# Patient Record
Sex: Female | Born: 1995 | Race: White | Hispanic: No
Health system: Western US, Academic
[De-identification: ages and names within clinical notes are randomized; demographics above are authoritative.]

## PROBLEM LIST (undated history)

## (undated) DIAGNOSIS — M6790 Unspecified disorder of synovium and tendon, unspecified site: Secondary | ICD-10-CM

## (undated) DIAGNOSIS — G40909 Epilepsy, unspecified, not intractable, without status epilepticus: Secondary | ICD-10-CM

## (undated) DIAGNOSIS — G43909 Migraine, unspecified, not intractable, without status migrainosus: Secondary | ICD-10-CM

## (undated) HISTORY — DX: Unspecified disorder of synovium and tendon, unspecified site: M67.90

## (undated) HISTORY — DX: Migraine, unspecified, not intractable, without status migrainosus: G43.909

## (undated) HISTORY — PX: TYMPANOSTOMY TUBE PLACEMENT: SHX32

## (undated) HISTORY — DX: Epilepsy, unspecified, not intractable, without status epilepticus: G40.909

## (undated) HISTORY — PX: BREAST SURGERY: SHX581

---
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
## 2013-01-16 ENCOUNTER — Telehealth: Payer: Self-pay | Admitting: Pediatrics

## 2013-01-16 NOTE — Telephone Encounter (Addendum)
Headache calendar from May 2014 on [place]. 30 days were recorded.  16 days were headache free.  10 days were associated with tension type headaches, 7 required treatment.  There were 4 days of migraines, 2 were severe.  I left a message with the patient to call me at 6 PM on June 4.I spoke with the patient for a minute and a half.  She does not want to make changes in her medication.  She apparently has very detailed notes on her bone calendar but we will discuss when I see her on June 16.

## 2013-01-26 DIAGNOSIS — G40309 Generalized idiopathic epilepsy and epileptic syndromes, not intractable, without status epilepticus: Secondary | ICD-10-CM | POA: Insufficient documentation

## 2013-01-26 DIAGNOSIS — G40209 Localization-related (focal) (partial) symptomatic epilepsy and epileptic syndromes with complex partial seizures, not intractable, without status epilepticus: Secondary | ICD-10-CM | POA: Insufficient documentation

## 2013-01-26 DIAGNOSIS — G43009 Migraine without aura, not intractable, without status migrainosus: Secondary | ICD-10-CM | POA: Insufficient documentation

## 2013-01-30 ENCOUNTER — Encounter: Payer: Self-pay | Admitting: Pediatrics

## 2013-01-30 ENCOUNTER — Ambulatory Visit (INDEPENDENT_AMBULATORY_CARE_PROVIDER_SITE_OTHER): Payer: 59 | Admitting: Pediatrics

## 2013-01-30 VITALS — BP 100/60 | HR 84 | Ht 63.25 in | Wt 119.4 lb

## 2013-01-30 DIAGNOSIS — G40309 Generalized idiopathic epilepsy and epileptic syndromes, not intractable, without status epilepticus: Secondary | ICD-10-CM

## 2013-01-30 DIAGNOSIS — G43009 Migraine without aura, not intractable, without status migrainosus: Secondary | ICD-10-CM

## 2013-01-30 DIAGNOSIS — G40209 Localization-related (focal) (partial) symptomatic epilepsy and epileptic syndromes with complex partial seizures, not intractable, without status epilepticus: Secondary | ICD-10-CM

## 2013-01-30 NOTE — Progress Notes (Addendum)
Patient: Mary Jacobs MRN: [id number] Sex: female DOB: Jan 10, 1996  Provider: Deetta Perla, MD Location of Care: [facility]  Note type: Routine return visit  History of Present Illness: Referral Source: Dr. Bufford SpikesNicholaus Bloom History from: mother, patient and [facility] chart Chief Complaint: Seizure/Migraines  Mary Jacobs is a 17 y.o. female who returns for evaluation of seizures, migraines, and episodic tension type headaches.Mary Jacobs returns today for the first time since August 08, 2012 with a history of partial onset seizures with secondary generalization that are nocturnal and a mixture of episodic tension type headaches and migraine without aura.  Her last seizure was on September 20, 2011, when she forgot to take her nighttime dose of medicine.  She has kept detailed records of her headaches, which worsened during the school year and lessened considerably since the conclusion of the school year.  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  She is going to [employer] and has not yet chosen a major.  Her headaches have been treated with propranolol and topiramate which failed to control her headaches.  I am not certain she ever had cognitive effects of the topiramate, but one option if verapamil fails to prevent her headaches is Trokendi another Depakote, beta blockers, Effexor, Keppra, and cyproheptadine.  We have also not tried magnesium, riboflavin or feverfew.  These were all possible options.  For the time being, her headaches have settled during the summer.  I suspect that stress is the major determinant of her headaches.  I do not know whether college will prove to be more or less stressful than high school.  I think that she may have more time to herself.  She is a very disciplined and organized  young woman and if she continues organized behavior, she may get more sleep in college than she did in high school.  Review of Systems: 12 system review was remarkable for headache  Past Medical History  Diagnosis Date  . Migraine   . Epilepsy    Hospitalizations: yes, Head Injury: no, Nervous System Infections: no, Immunizations up to date: yes Past Medical History Comments: Patient was hospitalized at 17 years old due to seizure activity. Seizures were partial onset with secondary generalization.  MRI of the brain was normal.  EEG showed diffuse slowing as part of a post ictal state. She had onset of migraines at 35 or 17 years of age. She has taken Zomig with transient benefit.  Propranolol was started in 2008 as a preventative medication for migraine increased to 20 mg BId.  She has tried Maxalt, Axert, and Relpax without benefit.  Imitrex/sumatriptan has been more effective.  Atenolol was started in Ausgust, 2009 and adjusted to 100 mg.  Topiramate was started March, 2011.  She reached 50 mg q HS and stopped it in January, 2012 due to cognitive blunting.  Verapamil was started February, 2013.  Birth History 8 pound infant born to a gravida 3 para 2002 woman. Pregnancy labor and delivery were unremarkable. Patient went home with her mother. Growth and development was normal.  Behavior History none  Surgical History Past Surgical History  Procedure Laterality Date  . Tympanostomy tube placement     Surgeries: no Surgical History Comments: None  Family History family history includes Asthma in her maternal grandmother; Breast cancer in her maternal aunt; Cancer  in her brother and mother; Migraines in her brother, maternal uncle, and mother; Rectal cancer in her paternal grandfather; and Stroke in her maternal grandfather. Family History is negative migraines, seizures, cognitive impairment, blindness, deafness, birth defects, chromosomal disorder, autism.  Social History History    Social History  . Marital Status: Single    Spouse Name: N/A    Number of Children: N/A  . Years of Education: N/A   Social History Main Topics  . Smoking status: Never Smoker   . Smokeless tobacco: Never Used  . Alcohol Use: No  . Drug Use: No  . Sexually Active: Not Currently   Other Topics Concern  . None   Social History Narrative  . 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  Hobbies/Interest: [employer], lacrosse, reading School comments Mary Jacobs has done well in her studies.  Current Outpatient Prescriptions on File Prior to Visit  Medication Sig Dispense Refill  . levETIRAcetam (KEPPRA) 500 MG tablet Take 500 mg by mouth every 12 (twelve) hours. 3 tabs bid       . SUMATRIPTAN SUCCINATE PO Take 100 mg by mouth as needed (1 at onset of migraine with 400 mg of Ibuprofen). Prn        No current facility-administered medications on file prior to visit.   The medication list was reviewed and reconciled. All changes or newly prescribed medications were explained.  A complete medication list was provided to the patient/caregiver.  No Known Allergies  Physical Exam BP 100/60  Pulse 84  Ht 5' 3.25" (1.607 m)  Wt 119 lb 6.4 oz (54.159 kg)  BMI 20.97 kg/m2  General: alert, well developed, well nourished, in no acute distress, blond hair, blue eyes, right handed Head: normocephalic, no dysmorphic features Ears, Nose and Throat: Otoscopic: Tympanic membranes normal.  Pharynx: oropharynx is pink without exudates or tonsillar hypertrophy. Neck: supple, full range of motion, no cranial or cervical bruits Respiratory: auscultation clear Cardiovascular: no murmurs, pulses are normal Musculoskeletal: no skeletal deformities or apparent scoliosis Skin: no rashes or neurocutaneous lesions  Neurologic  Exam  Mental Status: alert; oriented to person, place and year; knowledge is normal for age; language is normal Cranial Nerves: visual fields are full to double simultaneous stimuli; extraocular movements are full and conjugate; pupils are around reactive to light; funduscopic examination shows sharp disc margins with normal vessels; symmetric facial strength; midline tongue and uvula; air conduction is greater than bone conduction bilaterally. Motor: Normal strength, tone and mass; good fine motor movements; no pronator drift. Sensory: intact responses to cold, vibration, proprioception and stereognosis Coordination: good finger-to-nose, rapid repetitive alternating movements and finger apposition Gait and Station: normal gait and station: patient is able to walk on heels, toes and tandem without difficulty; balance is adequate; Romberg exam is negative; Gower response is negative Reflexes: symmetric and diminished bilaterally; no clonus; bilateral flexor plantar responses.  Assessment 1. Complex partial seizures with secondary generalization, well controlled (345.40), (345.10). 2. Migraine without aura (346.10). 3. Episodic tension type headaches (339.11).  Plan 1. Continue verapamil for now at its current dose.  Other options have been noted above. 2. She will continue to keep daily prospective headache calendars that will be sent to my office at the end of each month.  I will contact her and discuss changes in treatment. 3. We will work out logistics to make certain  that she can get her medication in [place] on a timely basis. 4. I will definitely see her in December 2014 at Christmas break and would be happy to see her in October 2014, if she is having difficulty with headaches or seizures.  I spent 30 minutes of face-to-face time with [facility] and her mother, more than half of it in consultation.  Meds ordered this encounter  Medications  . verapamil (CALAN) 40 MG tablet    Sig: Take 40  mg by mouth 2 (two) times daily. 2 po q am and 3 po q hs   Deetta Perla MD

## 2013-01-30 NOTE — Patient Instructions (Signed)
Have. a great summer!   Let me know what arrangements need to be made for your prescriptions.  I will see you in October if need be and definitely in December.

## 2013-02-22 ENCOUNTER — Ambulatory Visit (INDEPENDENT_AMBULATORY_CARE_PROVIDER_SITE_OTHER): Payer: 59 | Admitting: Women's Health

## 2013-02-22 ENCOUNTER — Encounter: Payer: Self-pay | Admitting: Women's Health

## 2013-02-22 VITALS — BP 106/66 | Ht 63.5 in | Wt 121.0 lb

## 2013-02-22 DIAGNOSIS — Z01419 Encounter for gynecological examination (general) (routine) without abnormal findings: Secondary | ICD-10-CM

## 2013-02-22 NOTE — Progress Notes (Signed)
Patient ID: Mary Jacobs, female   DOB: 08/10/1996, 17 y.o.   MRN: [id number] Presents for College physical form to be completed. Had annual exam in December. Gardasil series completed. Not sexually active has a long-term boyfriend. History of epilepsy seizure-free for greater than one year. 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 Without complaint today. Requesting Mirena. Regular monthly cycle.  Exam: Appears well.   Epilepsy on Keppra Migraines without aura. Contraception management  Plan: Options reviewed, Mirena information given and reviewed slight risk for infection, perforation, hemorrhage. Reviewed Dr. Audie Box would place with cycle will check coverage. SBE's, continue healthy exercise and diet. College safety reviewed, reviewed importance of self-care in relationship to epilepsy. Health physical and  form completed

## 2013-02-23 ENCOUNTER — Other Ambulatory Visit: Payer: Self-pay

## 2013-02-23 ENCOUNTER — Other Ambulatory Visit: Payer: Self-pay | Admitting: Gynecology

## 2013-02-23 ENCOUNTER — Telehealth: Payer: Self-pay | Admitting: Gynecology

## 2013-02-23 DIAGNOSIS — G40209 Localization-related (focal) (partial) symptomatic epilepsy and epileptic syndromes with complex partial seizures, not intractable, without status epilepticus: Secondary | ICD-10-CM

## 2013-02-23 DIAGNOSIS — Z3049 Encounter for surveillance of other contraceptives: Secondary | ICD-10-CM

## 2013-02-23 MED ORDER — KEPPRA 500 MG PO TABS: ORAL_TABLET | ORAL | Status: DC

## 2013-02-23 MED ORDER — LEVONORGESTREL 20 MCG/24HR IU IUD: INTRAUTERINE_SYSTEM | Freq: Once | INTRAUTERINE | Status: DC

## 2013-02-23 NOTE — Telephone Encounter (Signed)
02/23/13-Pt called today and told that Mirena IUD & insertion covered at 100% by her [employer] ins. She will call next cycle to schedule with TF/WL

## 2013-03-20 ENCOUNTER — Encounter: Payer: Self-pay | Admitting: Gynecology

## 2013-03-20 ENCOUNTER — Ambulatory Visit (INDEPENDENT_AMBULATORY_CARE_PROVIDER_SITE_OTHER): Payer: 59 | Admitting: Gynecology

## 2013-03-20 DIAGNOSIS — N882 Stricture and stenosis of cervix uteri: Secondary | ICD-10-CM

## 2013-03-20 DIAGNOSIS — Z30431 Encounter for routine checking of intrauterine contraceptive device: Secondary | ICD-10-CM

## 2013-03-20 NOTE — Patient Instructions (Signed)
Intrauterine Device Insertion Most often, an intrauterine device (IUD) is inserted into the uterus to prevent pregnancy. There are 2 types of IUDs available:  Copper IUD. This type of IUD creates an environment that is not favorable to sperm survival. The mechanism of action of the copper IUD is not known for certain. It can stay in place for 10 years.  Hormone IUD. This type of IUD contains the hormone progestin (synthetic progesterone). The progestin thickens the cervical mucus and prevents sperm from entering the uterus, and it also thins the uterine lining. There is no evidence that the hormone IUD prevents implantation. The hormone IUD can stay in place for up to 5 years. An IUD is the most cost-effective birth control if left in place for the full duration. It may be removed at any time. LET YOUR CAREGIVER KNOW ABOUT:  Sensitivity to metals.  Medicines taken including herbs, eyedrops, over-the-counter medicines, and creams.  Use of steroids (by mouth or creams).  Previous problems with anesthetics or numbing medicine.  Previous gynecological surgery.  History of blood clots or clotting disorders.  Possibility of pregnancy.  Menstrual irregularities.  Concerns regarding unusual vaginal discharge or odors.  Previous experience with an IUD.  Other health problems. RISKS AND COMPLICATIONS  Accidental puncture (perforation) of the uterus.  Accidental placement of the IUD either in the muscle layer of the uterus (myometrium) or outside the uterus. If this happen, the IUD can be found essentially floating around the bowels. When this happens, the IUD must be taken out surgically.  The IUD may fall out of the uterus (expulsion). This is more common in women who have recently had a child.   Pregnancy in the fallopian tube (ectopic). BEFORE THE PROCEDURE  Schedule the IUD insertion for when you will have your menstrual period or right after, to make sure you are not pregnant.  Placement of the IUD is better tolerated shortly after a menstrual cycle.  You may need to take tests or be examined to make sure you are not pregnant.  You may be required to take a pregnancy test.  You may be required to get checked for sexually transmitted infections (STIs) prior to placement. Placing an IUD in someone who has an infection can make an infection worse.  You may be given a pain reliever to take 1 or 2 hours before the procedure.  An exam will be performed to determine the size and position of your uterus.  Ask your caregiver about changing or stopping your regular medicines. PROCEDURE   A tool (speculum) is placed in the vagina. This allows your caregiver to see the lower part of the uterus (cervix).  The cervix is prepped with a medicine that lowers the risk of infection.  You may be given a medicine to numb each side of the cervix (intracervical or paracervical block). This is used to block and control any discomfort with insertion.  A tool (uterine sound) is inserted into the uterus to determine the length of the uterine cavity and the direction the uterus may be tilted.  A slim instrument (IUD inserter) is inserted through the cervical canal and into your uterus.  The IUD is placed in the uterine cavity and the insertion device is removed.  The nylon string that is attached to the IUD, and used for eventual IUD removal, is trimmed. It is trimmed so that it lays high in the vagina, just outside the cervix. AFTER THE PROCEDURE  You may have bleeding after the   procedure. This is normal. It varies from light spotting for a few days to menstrual-like bleeding.  You may have mild cramping.  Practice checking the string coming out of the cervix to make sure the IUD remains in the uterus. If you cannot feel the string, you should schedule a "string check" with your caregiver.  If you had a hormone IUD inserted, expect that your period may be lighter or nonexistent  within a year's time (though this is not always the case). There may be delayed fertility with the hormone IUD as a result of its progesterone effect. When you are ready to become pregnant, it is suggested to have the IUD removed up to 1 year in advance.  Yearly exams are advised. Document Released: 04/01/2011 Document Revised: 10/26/2011 Document Reviewed: 04/01/2011 [employer] Patient Information 2014 [employer].  

## 2013-03-20 NOTE — Progress Notes (Signed)
Patient presents for Mirena IUD placement. She has read through the booklet, has no contraindications and signed the consent form. She is virginal with LMP last week.  Had discussed with Harriett Sine previously. Does have an issue with epilepsy and migraines. I reviewed all contraceptive options to include pill patch ring Depo-Provera nexplanon and IUDs because progesterone and copper-based. Pros/cons risks/benefits of each option discussed.  Need to continue condoms regardless when she does become sexually active to decrease STD risk reviewed. I reviewed the insertional process with her as well as the risks to include infection either immediate or long-term, uterine perforation or migration requiring surgery to remove, other complications such as pain, hormonal side effects, infertility and possibilities of failure with subsequent pregnancy. Discussed using a paracervical block given her nulliparous status and virginal status.  Exam with Selena Batten assistant Pelvic: External BUS vagina normal with exam confirming her virginal status. Cervix normal . Uterus anteverted normal size shape contour midline mobile nontender. Adnexa without masses or tenderness.  Procedure: The cervix was cleansed with Betadine and a paracervical block using 1% lidocaine, 6 cc total placed. The anterior lip of the cervix was grasped with a single-tooth tenaculum, the uterus was sounded and a Mirena IUD was placed according to manufacturer's recommendations without difficulty. The strings were trimmed. The patient tolerated well and will follow up in one month for a postinsertional check.  Lot number:  [id number]  Addendum: After leaving the office she came back with her friend who drove her feeling very lightheaded. The patient appeared to have a classic vasovagal response as she was pale and lightheaded. She never lost consciousness. We laid her down. Her blood pressure is 100/60, pulse 80. She very quickly felt better although was still having a  lot of cramping. We gave her Motrin and she ultimately had left with her friend feeling better.  Note: This document was prepared with digital dictation and possible smart phrase technology. Any transcriptional errors that result from this process are unintentional.

## 2013-03-21 ENCOUNTER — Telehealth: Payer: Self-pay | Admitting: *Deleted

## 2013-03-21 NOTE — Telephone Encounter (Signed)
Left message on pt voicemail to call back to check with MD.

## 2013-03-21 NOTE — Telephone Encounter (Signed)
Pt said pt is doing very well and feels better.

## 2013-03-21 NOTE — Telephone Encounter (Signed)
Message copied by Aura Camps on Tue Mar 21, 2013  8:45 AM ------      Message from: Dara Lords      Created: Mon Mar 20, 2013  4:39 PM       Call patient August 5th in followup. She a lot of cramping and dizziness when we placed the IUD August 4th. I tried calling her late today but she did not answer her phoneand I wanted to make sure she is doing okay tomorrow. ------

## 2013-03-27 ENCOUNTER — Telehealth: Payer: Self-pay | Admitting: *Deleted

## 2013-03-27 MED ORDER — IBUPROFEN 800 MG PO TABS: 800.0000 mg | ORAL_TABLET | Freq: Three times a day (TID) | ORAL | Status: DC | PRN

## 2013-03-27 NOTE — Telephone Encounter (Signed)
Pt informed with the below note, rx sent. 

## 2013-03-27 NOTE — Telephone Encounter (Signed)
Pt had IUD placed on 03/20/13 pt said she has been having mild cramping everyday since placement date. Pt is taking OTC Advil, not in pain, more so annoying to have cramping on daily basis. She asked if this is normal? Besides cramping done well, pt said she been taking a lot of pills to help with discomfort. Please advise

## 2013-03-27 NOTE — Telephone Encounter (Signed)
Not unusual to have some cramping on and off for the first week to several weeks. Recommend ibuprofen 800 mg prescription strength 1 every 8 hours as needed #30 with 1 refill

## 2013-04-30 ENCOUNTER — Telehealth: Payer: Self-pay | Admitting: Pediatrics

## 2013-04-30 DIAGNOSIS — G40209 Localization-related (focal) (partial) symptomatic epilepsy and epileptic syndromes with complex partial seizures, not intractable, without status epilepticus: Secondary | ICD-10-CM

## 2013-04-30 DIAGNOSIS — G43009 Migraine without aura, not intractable, without status migrainosus: Secondary | ICD-10-CM

## 2013-04-30 MED ORDER — VERAPAMIL HCL 40 MG PO TABS: ORAL_TABLET | ORAL | Status: DC

## 2013-04-30 MED ORDER — KEPPRA 500 MG PO TABS: ORAL_TABLET | ORAL | Status: DC

## 2013-04-30 MED ORDER — SUMATRIPTAN SUCCINATE 100 MG PO TABS: ORAL_TABLET | ORAL | Status: DC

## 2013-04-30 NOTE — Telephone Encounter (Signed)
Called by family.  Patient is having a severe migraine and is out of medication.  All scripts electronically sent to  [facility] in [place], [place].

## 2013-05-01 ENCOUNTER — Other Ambulatory Visit: Payer: Self-pay

## 2013-05-01 DIAGNOSIS — G43009 Migraine without aura, not intractable, without status migrainosus: Secondary | ICD-10-CM

## 2013-05-01 MED ORDER — SUMATRIPTAN SUCCINATE 100 MG PO TABS: ORAL_TABLET | ORAL | Status: DC

## 2013-06-14 ENCOUNTER — Telehealth: Payer: Self-pay

## 2013-06-14 NOTE — Telephone Encounter (Signed)
Marcelino Duster, mother, lvm stating that the last time pt was seen, Dr.H told her to call for a f/u during school break and that he would fit her in. Mom said that pt will be home from college at the end of December- beginning January. Mom is asking that an appt be scheduled for the week of Dec 22nd or 29th. Please call mother to schedule at [phone].

## 2013-06-14 NOTE — Telephone Encounter (Signed)
Mary Jacobs, could you schedule her with Dr Sharene Skeans during this time? Thanks, Inetta Fermo

## 2013-06-14 NOTE — Telephone Encounter (Signed)
I spoke with Marcelino Duster the patient's mom and an appointment has been scheduled for Dec. 15, 2014 at 9:00 am with an arrival time of 8:45 am, mom confirmed appointment. MB

## 2013-06-20 ENCOUNTER — Ambulatory Visit (INDEPENDENT_AMBULATORY_CARE_PROVIDER_SITE_OTHER): Payer: 59 | Admitting: Gynecology

## 2013-06-20 ENCOUNTER — Encounter: Payer: Self-pay | Admitting: Gynecology

## 2013-06-20 DIAGNOSIS — Z30431 Encounter for routine checking of intrauterine contraceptive device: Secondary | ICD-10-CM

## 2013-06-20 NOTE — Progress Notes (Signed)
Patient presents for Mirena IUD placement followup exam. She's without complaints.  Exam with Selena Batten assistant External BUS vagina normal. Cervix normal with IUD string visualized an appropriate length. Uterus anteverted normal size midline mobile nontender. Adnexa without masses or tenderness.  Assessment and plan: Normal Mirena IUD followup exam. Patient will keep menstrual calendar. Followup July 2015 for annual exam. Sooner if any issues.

## 2013-06-20 NOTE — Patient Instructions (Signed)
.tpfIntrauterine Device Insertion Most often, an intrauterine device (IUD) is inserted into the uterus to prevent pregnancy. There are 2 types of IUDs available:  Copper IUD. This type of IUD creates an environment that is not favorable to sperm survival. The mechanism of action of the copper IUD is not known for certain. It can stay in place for 10 years.  Hormone IUD. This type of IUD contains the hormone progestin (synthetic progesterone). The progestin thickens the cervical mucus and prevents sperm from entering the uterus, and it also thins the uterine lining. There is no evidence that the hormone IUD prevents implantation. The hormone IUD can stay in place for up to 5 years. An IUD is the most cost-effective birth control if left in place for the full duration. It may be removed at any time. LET YOUR CAREGIVER KNOW ABOUT:  Sensitivity to metals.  Medicines taken including herbs, eyedrops, over-the-counter medicines, and creams.  Use of steroids (by mouth or creams).  Previous problems with anesthetics or numbing medicine.  Previous gynecological surgery.  History of blood clots or clotting disorders.  Possibility of pregnancy.  Menstrual irregularities.  Concerns regarding unusual vaginal discharge or odors.  Previous experience with an IUD.  Other health problems. RISKS AND COMPLICATIONS  Accidental puncture (perforation) of the uterus.  Accidental placement of the IUD either in the muscle layer of the uterus (myometrium) or outside the uterus. If this happen, the IUD can be found essentially floating around the bowels. When this happens, the IUD must be taken out surgically.  The IUD may fall out of the uterus (expulsion). This is more common in women who have recently had a child.   Pregnancy in the fallopian tube (ectopic). BEFORE THE PROCEDURE  Schedule the IUD insertion for when you will have your menstrual period or right after, to make sure you are not  pregnant. Placement of the IUD is better tolerated shortly after a menstrual cycle.  You may need to take tests or be examined to make sure you are not pregnant.  You may be required to take a pregnancy test.  You may be required to get checked for sexually transmitted infections (STIs) prior to placement. Placing an IUD in someone who has an infection can make an infection worse.  You may be given a pain reliever to take 1 or 2 hours before the procedure.  An exam will be performed to determine the size and position of your uterus.  Ask your caregiver about changing or stopping your regular medicines. PROCEDURE   A tool (speculum) is placed in the vagina. This allows your caregiver to see the lower part of the uterus (cervix).  The cervix is prepped with a medicine that lowers the risk of infection.  You may be given a medicine to numb each side of the cervix (intracervical or paracervical block). This is used to block and control any discomfort with insertion.  A tool (uterine sound) is inserted into the uterus to determine the length of the uterine cavity and the direction the uterus may be tilted.  A slim instrument (IUD inserter) is inserted through the cervical canal and into your uterus.  The IUD is placed in the uterine cavity and the insertion device is removed.  The nylon string that is attached to the IUD, and used for eventual IUD removal, is trimmed. It is trimmed so that it lays high in the vagina, just outside the cervix. AFTER THE PROCEDURE  You may have bleeding after the  procedure. This is normal. It varies from light spotting for a few days to menstrual-like bleeding.  You may have mild cramping.  Practice checking the string coming out of the cervix to make sure the IUD remains in the uterus. If you cannot feel the string, you should schedule a "string check" with your caregiver.  If you had a hormone IUD inserted, expect that your period may be lighter or  nonexistent within a year's time (though this is not always the case). There may be delayed fertility with the hormone IUD as a result of its progesterone effect. When you are ready to become pregnant, it is suggested to have the IUD removed up to 1 year in advance.  Yearly exams are advised. Document Released: 04/01/2011 Document Revised: 10/26/2011 Document Reviewed: 04/01/2011 [facility] Patient Information 2014 [place], [place].

## 2013-07-31 ENCOUNTER — Encounter: Payer: Self-pay | Admitting: Pediatrics

## 2013-07-31 ENCOUNTER — Ambulatory Visit (INDEPENDENT_AMBULATORY_CARE_PROVIDER_SITE_OTHER): Payer: 59 | Admitting: Pediatrics

## 2013-07-31 VITALS — BP 104/64 | HR 72 | Ht 63.25 in | Wt 119.8 lb

## 2013-07-31 DIAGNOSIS — G43009 Migraine without aura, not intractable, without status migrainosus: Secondary | ICD-10-CM

## 2013-07-31 DIAGNOSIS — G40209 Localization-related (focal) (partial) symptomatic epilepsy and epileptic syndromes with complex partial seizures, not intractable, without status epilepticus: Secondary | ICD-10-CM

## 2013-07-31 DIAGNOSIS — G40309 Generalized idiopathic epilepsy and epileptic syndromes, not intractable, without status epilepticus: Secondary | ICD-10-CM

## 2013-07-31 NOTE — Progress Notes (Signed)
Patient: Mary Jacobs MRN: [id number] Sex: female DOB: Dec 18, 1995  Provider: Deetta Perla, MD Location of Care: [facility]  Note type: Routine return visit  History of Present Illness: Referral Source: Dr. Berline Lopes History from: mother, patient and [facility] chart Chief Complaint: Migraine/Epilepsy  Mary Jacobs is a 17 y.o. 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  The patient returns July 31, 2013 for the first time since January 30, 2013.  She has localization related epilepsy with secondary generalization.  Her seizures have been nocturnal.  Her last occurred September 20, 2011 when she forgot to take her nighttime dose of medicine.  She has a mixture of episodic tension-type headaches and migraine without aura.  She is on preventative medication, and sumatriptan plus ibuprofen seems to treat her headaches quite well.  The headaches have become so infrequent, that she has not kept records.  This is clearly the best that she has done since I began to provide care for her many years ago.  She will complete her sophomore year at the end of her first term in school because she entered with so many credits from advanced placement courses in [employer].  She is undecided about her major.  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  She is also involved in service clubs.  She is enjoying [place], doing well in school, getting adequate sleep.  She has a problem with her current roommate and is moving out into an apartment with one of her best friends.  Her health has been good.  Review of Systems: 12 system review was unremarkable  Past Medical History  Diagnosis Date  . Migraine   . Epilepsy    Hospitalizations: yes, Head Injury: no, Nervous System Infections: no, Immunizations up to date: yes Past Medical History Comments:   Patient was hospitalized at 17 years old  due to seizure activity. Seizures were partial onset with secondary generalization. MRI of the brain was normal. EEG showed diffuse slowing as part of a post ictal state. She had onset of migraines at 25 or 17 years of age. Keppra has completely controlled her seizures, except for times when there have been issues with compliance.  She has taken Zomig with transient benefit. Propranolol was started in 2008 as a preventative medication for migraine increased to 20 mg BId. She has tried Maxalt, Axert, and Relpax without benefit. Imitrex/sumatriptan has been more effective. Atenolol was started in August, 2009 and adjusted to 100 mg. Topiramate was started March, 2011. She reached 50 mg q HS and stopped it in January, 2012 due to cognitive blunting. Verapamil was started February, 2013.  Birth History 8 pound infant born to a gravida 3 para 2002 woman.  Pregnancy labor and delivery were unremarkable.  Patient went home with her mother.  Growth and development was normal.  Behavior History none  Surgical History Past Surgical History  Procedure Laterality Date  . Tympanostomy tube placement    . Mirena      Inserted 03-20-13    Family History family history includes Asthma in her maternal grandmother; Breast cancer in her maternal aunt; Cancer in her brother and mother; Migraines in her brother, maternal uncle, and mother; Rectal cancer in her paternal grandfather; Stroke in her maternal grandfather. Family History is negative migraines, seizures, cognitive impairment, blindness, deafness, birth defects, chromosomal disorder, autism.  Social History History   Social History  . Marital  Status: Single    Spouse Name: N/A    Number of Children: N/A  . Years of Education: N/A   Social History Main Topics  . Smoking status: Never Smoker   . Smokeless tobacco: Never Used  . Alcohol Use: No  . Drug Use: No  . Sexual Activity: Not Currently    Birth Control/ Protection: IUD     Comment:  Mirena inserted 03-20-13   Other Topics Concern  . None   Social History Narrative  . None   Educational level: university; School Attending: [employer] Occupation: Student  Living with other student on campus during the school year and with parents when school is out for holidays and summer months  Hobbies/Interest: Zona enjoys sailing and she's on a sailing team at school. School comments Addy is doing well in college, she's still undecided about her major. She wll have completed her sophomore year by the teim she has finished finals.  She has a Barista.  Current Outpatient Prescriptions on File Prior to Visit  Medication Sig Dispense Refill  . ibuprofen (ADVIL,MOTRIN) 800 MG tablet Take 1 tablet (800 mg total) by mouth every 8 (eight) hours as needed for pain.  30 tablet  1  . KEPPRA 500 MG tablet Take 3 tabs by mouth in the morning and 3 tabs at bedtime  186 tablet  5  . SUMAtriptan (IMITREX) 100 MG tablet Take 1 tablet with 400 mg of ibuprofen; May repeat in 2 hours if headache persists or recurs.  12 tablet  5  . verapamil (CALAN) 40 MG tablet 2 po q am and 3 po q hs  155 tablet  5   Current Facility-Administered Medications on File Prior to Visit  Medication Dose Route Frequency Provider Last Rate Last Dose  . levonorgestrel (MIRENA) 20 MCG/24HR IUD   Intrauterine Once Dara Lords, MD       The medication list was reviewed and reconciled. All changes or newly prescribed medications were explained.  A complete medication list was provided to the patient/caregiver.  No Known Allergies  Physical Exam BP 104/64  Pulse 72  Ht 5' 3.25" (1.607 m)  Wt 119 lb 12.8 oz (54.341 kg)  BMI 21.04 kg/m2  General: alert, well developed, well nourished, in no acute distress, blond hair, blue eyes, right handed Head: normocephalic, no dysmorphic features Ears, Nose and Throat: Otoscopic: Tympanic membranes normal.  Pharynx: oropharynx is pink without exudates or  tonsillar hypertrophy. Neck: supple, full range of motion, no cranial or cervical bruits Respiratory: auscultation clear Cardiovascular: no murmurs, pulses are normal Musculoskeletal: no skeletal deformities or apparent scoliosis Skin: no rashes or neurocutaneous lesions  Neurologic Exam  Mental Status: alert; oriented to person, place and year; knowledge is normal for age; language is normal Cranial Nerves: visual fields are full to double simultaneous stimuli; extraocular movements are full and conjugate; pupils are around reactive to light; funduscopic examination shows sharp disc margins with normal vessels; symmetric facial strength; midline tongue and uvula; air conduction is greater than bone conduction bilaterally. Motor: Normal strength, tone and mass; good fine motor movements; no pronator drift. Sensory: intact responses to cold, vibration, proprioception and stereognosis Coordination: good finger-to-nose, rapid repetitive alternating movements and finger apposition Gait and Station: normal gait and station: patient is able to walk on heels, toes and tandem without difficulty; balance is adequate; Romberg exam is negative; Gower response is negative Reflexes: symmetric and diminished bilaterally; no clonus; bilateral flexor plantar responses.  Assessment 1.  Localization related epilepsy with secondary generalization 345.40, 345.10. 2. Migraine without aura 346.10. 3. Episodic tension-type headaches 339.11.  Plan Continue verapamil, levetiracetam, and sumatriptan.  I checked and she does not need refills on any of those at this time.  I will refill them as needed.  She will return to see me in six months' time sooner depending upon clinical need.  I spent 30 minutes of face-to-face time with the patient and her mother more than half of it in consultation.  Deetta Perla MD

## 2013-10-23 ENCOUNTER — Other Ambulatory Visit: Payer: Self-pay

## 2013-10-23 DIAGNOSIS — G43009 Migraine without aura, not intractable, without status migrainosus: Secondary | ICD-10-CM

## 2013-10-23 MED ORDER — VERAPAMIL HCL 40 MG PO TABS: ORAL_TABLET | ORAL | Status: DC

## 2014-01-29 ENCOUNTER — Other Ambulatory Visit: Payer: Self-pay

## 2014-01-29 ENCOUNTER — Other Ambulatory Visit: Payer: Self-pay | Admitting: Family

## 2014-01-29 DIAGNOSIS — G40209 Localization-related (focal) (partial) symptomatic epilepsy and epileptic syndromes with complex partial seizures, not intractable, without status epilepticus: Secondary | ICD-10-CM

## 2014-01-29 MED ORDER — KEPPRA 500 MG PO TABS: ORAL_TABLET | ORAL | Status: DC

## 2014-01-29 NOTE — Telephone Encounter (Signed)
Previous Rx did not print. TG 

## 2014-02-20 ENCOUNTER — Encounter: Payer: Self-pay | Admitting: Women's Health

## 2014-02-20 ENCOUNTER — Ambulatory Visit (INDEPENDENT_AMBULATORY_CARE_PROVIDER_SITE_OTHER): Payer: 59 | Admitting: Women's Health

## 2014-02-20 VITALS — BP 105/68 | Ht 63.0 in | Wt 123.6 lb

## 2014-02-20 DIAGNOSIS — E079 Disorder of thyroid, unspecified: Secondary | ICD-10-CM

## 2014-02-20 DIAGNOSIS — Z01419 Encounter for gynecological examination (general) (routine) without abnormal findings: Secondary | ICD-10-CM

## 2014-02-20 LAB — URINALYSIS W MICROSCOPIC + REFLEX CULTURE
Bacteria, UA: NONE SEEN
Bilirubin Urine: NEGATIVE
CASTS: NONE SEEN
Crystals: NONE SEEN
Glucose, UA: NEGATIVE mg/dL
Ketones, ur: NEGATIVE mg/dL
Nitrite: NEGATIVE
PH: 5.5 (ref 5.0–8.0)
Protein, ur: NEGATIVE mg/dL
Specific Gravity, Urine: >1.03 — ABNORMAL HIGH (ref 1.005–1.030)
Urobilinogen, UA: 0.2 mg/dL (ref 0.0–1.0)

## 2014-02-20 LAB — CBC WITH DIFFERENTIAL/PLATELET
Basophils Absolute: 0.1 10*3/uL (ref 0.0–0.1)
Basophils Relative: 1 % (ref 0–1)
EOS ABS: 0.3 10*3/uL (ref 0.0–0.7)
Eosinophils Relative: 4 % (ref 0–5)
HCT: 39 % (ref 36.0–46.0)
HEMOGLOBIN: 12.7 g/dL (ref 12.0–15.0)
LYMPHS ABS: 1.8 10*3/uL (ref 0.7–4.0)
Lymphocytes Relative: 26 % (ref 12–46)
MCH: 25.6 pg — AB (ref 26.0–34.0)
MCHC: 32.6 g/dL (ref 30.0–36.0)
MCV: 78.6 fL (ref 78.0–100.0)
MONO ABS: 0.5 10*3/uL (ref 0.1–1.0)
MONOS PCT: 7 % (ref 3–12)
NEUTROS ABS: 4.3 10*3/uL (ref 1.7–7.7)
Neutrophils Relative %: 62 % (ref 43–77)
Platelets: 302 10*3/uL (ref 150–400)
RBC: 4.96 MIL/uL (ref 3.87–5.11)
RDW: 15.9 % — AB (ref 11.5–15.5)
WBC: 6.9 10*3/uL (ref 4.0–10.5)

## 2014-02-20 LAB — TSH: TSH: 1.065 u[IU]/mL (ref 0.350–4.500)

## 2014-02-20 NOTE — Progress Notes (Signed)
Mary Jacobs [birth year] [id number]    History:    Presents for annual exam.  Amenorrheic, Mirena IUD placed 03/2013. [place]. Gardasil series completed. History of epilepsy seizure-free on Keppra/Dr. Gaynell Jacobs. History of migraines.  Past medical history, past surgical history, family history and social history were all reviewed and documented in the EPIC chart. 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 Sister thyroid problem.  ROS:  A  12 point ROS was performed and pertinent positives and negatives are included.  Exam:  Filed Vitals:   02/20/14 0926  BP: 105/68    General appearance:  Normal Thyroid:  Symmetrical, normal in size, without palpable masses or nodularity. Respiratory  Auscultation:  Clear without wheezing or rhonchi Cardiovascular  Auscultation:  Regular rate, without rubs, murmurs or gallops  Edema/varicosities:  Not grossly evident Abdominal  Soft,nontender, without masses, guarding or rebound.  Liver/spleen:  No organomegaly noted  Hernia:  None appreciated  Skin  Inspection:  Grossly normal   Breasts: Examined lying and sitting.     Right: Without masses, retractions, discharge or axillary adenopathy.     Left: Without masses, retractions, discharge or axillary adenopathy. Gentitourinary   Inguinal/mons:  Normal without inguinal adenopathy  External genitalia:  Normal  BUS/Urethra/Skene's glands:  Normal  Vagina:  Normal  Cervix:  Normal IUD strings visible  Uterus:   normal in size, shape and contour.  Midline and mobile  Adnexa/parametria:     Rt: Without masses or tenderness.   Lt: Without masses or tenderness.  Anus and perineum: Normal  Digital rectal exam: Normal sphincter tone without palpated masses or tenderness  Assessment/Plan:  18 y.o.SWF  [place]  for annual exam with no complaints.  Mirena IUD placed 03/2013-amenorrheic Epilepsy seizure-free greater than 2 years Dr. Gaynell Jacobs  Plan: SBE's, continue regular exercise, calcium rich  diet, MVI daily encouraged., campus safety reviewed. CBC, TSH, UA, condoms encouraged if become sexually active.   Note: This dictation was prepared with Dragon/digital dictation.  Any transcriptional errors that result are unintentional. Mary Jacobs Mary Jacobs, 11:06 AM 02/20/2014

## 2014-02-20 NOTE — Patient Instructions (Signed)

## 2014-02-22 LAB — URINE CULTURE
COLONY COUNT: NO GROWTH
Organism ID, Bacteria: NO GROWTH

## 2014-03-01 ENCOUNTER — Ambulatory Visit: Payer: 59 | Admitting: Pediatrics

## 2014-03-08 ENCOUNTER — Other Ambulatory Visit: Payer: Self-pay | Admitting: Family

## 2014-03-14 ENCOUNTER — Ambulatory Visit (INDEPENDENT_AMBULATORY_CARE_PROVIDER_SITE_OTHER): Payer: 59 | Admitting: Pediatrics

## 2014-03-14 VITALS — BP 100/70 | HR 68 | Ht 63.25 in | Wt 123.8 lb

## 2014-03-14 DIAGNOSIS — G43009 Migraine without aura, not intractable, without status migrainosus: Secondary | ICD-10-CM

## 2014-03-14 DIAGNOSIS — G40309 Generalized idiopathic epilepsy and epileptic syndromes, not intractable, without status epilepticus: Secondary | ICD-10-CM

## 2014-03-14 DIAGNOSIS — G40209 Localization-related (focal) (partial) symptomatic epilepsy and epileptic syndromes with complex partial seizures, not intractable, without status epilepticus: Secondary | ICD-10-CM

## 2014-03-14 MED ORDER — KEPPRA 500 MG PO TABS: ORAL_TABLET | ORAL | Status: DC

## 2014-03-14 MED ORDER — VERAPAMIL HCL 40 MG PO TABS: ORAL_TABLET | ORAL | Status: DC

## 2014-03-14 MED ORDER — SUMATRIPTAN SUCCINATE 100 MG PO TABS: ORAL_TABLET | ORAL | Status: DC

## 2014-03-14 NOTE — Progress Notes (Signed)
Patient: Mary Jacobs MRN: [id number] Sex: female DOB: 14-Jun-1996  Provider: Jodi Geralds, MD Location of Care: [facility]  Note type: Routine return visit  History of Present Illness: Referral Source: Dr. [name] History from: mother, patient and [facility] chart Chief Complaint: Migraines/Epilepsy   Mary Jacobs is a 18 y.o. female who returns for ongoing evaluation and management of epilepsy, and migraine without aura.  [name] returns March 14, 2014, for the first time since July 31, 2013.  She has localization related epilepsy with secondary generalization, and migraine without aura.    She has been seizure-free on levetiracetam for years except when there has been issues of compliance.  Her headaches worsened this spring and this summer.  She apparently had a very difficult time following breaking up from a long-term relationship with a boy.  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  That is where she attends school ([employer]).  She is majoring in an international business.  She also is competitively sailing.    She asked a question today about whether or not she could come off medication.  This would significantly impair her ability to independently move about from one place to another and I think would also raise some questions about safety in her competitive sailing.  About twice a week this spring and summer, she has experienced headaches that can last up to half a day in duration.  They are throbbing and hemicranial; others are sharply focused in a temple.  The more sharply focused headaches do not respond to Triptan medicine plus ibuprofen.  Many of her headaches occur when she awakens in the morning and these also are refractory to treatment.  She responds to treatment best when a headache happens in the middle of the day and she can treat it promptly.  She has been on a variety of Triptan medicines.  Imitrex has  worked best and we will continue that.  She has also been on a variety of preventative medications and verapamil has worked best, although with the increased frequency of her headaches, I think that she needs a higher dose.  Review of Systems: 12 system review was unremarkable  Past Medical History  Diagnosis Date  . Migraine   . Epilepsy    Hospitalizations: No., Head Injury: No., Nervous System Infections: No., Immunizations up to date: Yes.   Past Medical History Patient was hospitalized at 18 years old due to seizure activity. Seizures were partial onset with secondary generalization. MRI of the brain was normal. EEG showed diffuse slowing as part of a post ictal state. She had onset of migraines at 22 or 18 years of age. Keppra has completely controlled her seizures, except for times when there have been issues with compliance.   She has taken Zomig with transient benefit. Propranolol was started in 2008 as a preventative medication for migraine increased to 20 mg BId. She has tried Maxalt, Axert, and Relpax without benefit. Imitrex/sumatriptan has been more effective. Atenolol was started in August, 2009 and adjusted to 100 mg. Topiramate was started March, 2011. She reached 50 mg q HS and stopped it in January, 2012 due to cognitive blunting. Verapamil was started February, 2013.  Birth History 8 pound infant born to a gravida 3 para 2002 woman.  Pregnancy labor and delivery were unremarkable.  Patient went home with her mother.  Growth and development was normal.  Behavior History none  Surgical History Past Surgical History  Procedure Laterality Date  . Tympanostomy tube placement    . Mirena      Inserted 03-20-13   Surgeries: Yes.   Surgical History Comments: See Hx  Family History family history includes Asthma in her maternal grandmother; Breast cancer in her maternal aunt; Cancer in her brother and mother; Migraines in her brother, maternal uncle, and mother; Rectal  cancer in her paternal grandfather; Stroke in her maternal grandfather; Thyroid disease in her sister. Family History is negative migraines, seizures, cognitive impairment, blindness, deafness, birth defects, chromosomal disorder, autism.  Social History History   Social History  . Marital Status: Single    Spouse Name: N/A    Number of Children: N/A  . Years of Education: N/A   Social History Main Topics  . Smoking status: Never Smoker   . Smokeless tobacco: Never Used  . Alcohol Use: No  . Drug Use: No  . Sexual Activity: Not Currently    Birth Control/ Protection: IUD     Comment: Mirena inserted 03-20-13   Other Topics Concern  . None   Social History Narrative  . None   Educational level Mary Jacobs Attending: [employer] Occupation: Student  Living with room mates on college campus  Hobbies/Interest: Enjoys sailing  School comments Neeka is pursuing a degree in [employer].   Current Outpatient Prescriptions on File Prior to Visit  Medication Sig Dispense Refill  . ibuprofen (ADVIL,MOTRIN) 800 MG tablet Take 1 tablet (800 mg total) by mouth every 8 (eight) hours as needed for pain.  30 tablet  1  . KEPPRA 500 MG tablet Take 3 tabs by mouth in the morning and 3 tabs at bedtime  186 tablet  1  . SUMAtriptan (IMITREX) 100 MG tablet Take 1 tablet with 400 mg of ibuprofen; May repeat in 2 hours if headache persists or recurs.  12 tablet  5  . verapamil (CALAN) 40 MG tablet TAKE 2 TABLETS BY MOUTH EVERY MORNING AND 3 TABS DAILY AT BEDTIME  155 tablet  0   Current Facility-Administered Medications on File Prior to Visit  Medication Dose Route Frequency Provider Last Rate Last Dose  . levonorgestrel (MIRENA) 20 MCG/24HR IUD   Intrauterine Once Anastasio Auerbach, MD       The medication list was reviewed and reconciled. All changes or newly prescribed medications were explained.  A complete medication list was provided to the patient/caregiver.  No  Known Allergies  Physical Exam BP 100/70  Pulse 68  Ht 5' 3.25" (1.607 m)  Wt 123 lb 12.8 oz (56.155 kg)  BMI 21.74 kg/m2  General: alert, well developed, well nourished, in no acute distress, blond hair, blue eyes, right handed  Head: normocephalic, no dysmorphic features  Ears, Nose and Throat: Otoscopic: Tympanic membranes normal. Pharynx: oropharynx is pink without exudates or tonsillar hypertrophy.  Neck: supple, full range of motion, no cranial or cervical bruits  Respiratory: auscultation clear  Cardiovascular: no murmurs, pulses are normal  Musculoskeletal: no skeletal deformities or apparent scoliosis  Skin: no rashes or neurocutaneous lesions   Neurologic Exam   Mental Status: alert; oriented to person, place and year; knowledge is normal for age; language is normal  Cranial Nerves: visual fields are full to double simultaneous stimuli; extraocular movements are full and conjugate; pupils are around reactive to light; funduscopic examination shows sharp disc margins with normal vessels; symmetric facial strength; midline tongue and uvula; air conduction is greater than bone conduction bilaterally.  Motor: Normal strength,  tone and mass; good fine motor movements; no pronator drift.  Sensory: intact responses to cold, vibration, proprioception and stereognosis  Coordination: good finger-to-nose, rapid repetitive alternating movements and finger apposition  Gait and Station: normal gait and station: patient is able to walk on heels, toes and tandem without difficulty; balance is adequate; Romberg exam is negative; Gower response is negative  Reflexes: symmetric and diminished bilaterally; no clonus; bilateral flexor plantar responses.  Assessment 1. Migraine without aura without mention of intractable migraine or status migrainous, 346.10. 2. 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 3. Localization related epilepsy with complex partial  seizures without mention of intractable epilepsy, 345.40.  Plan I refilled prescriptions for Keppra, verapamil, and sumatriptan.  I will see her in followup in six months' time, but will be happy to see her over her winter break.  I have asked her to call me if the headaches worsen and requested that she keep a headache calendar, although I did not give her one.  We will mail one to her home.  I think for the time being that she does not want to try to come off levetiracetam.  I told her that she had only about 10% chance of getting off the medication.  I spent 30-minutes of face-to-face time with [facility] and her mother more than half of it in consultation.  Jodi Geralds, M.D.

## 2014-04-05 ENCOUNTER — Other Ambulatory Visit: Payer: Self-pay | Admitting: Family

## 2014-04-20 ENCOUNTER — Other Ambulatory Visit: Payer: Self-pay | Admitting: Family

## 2014-06-07 ENCOUNTER — Other Ambulatory Visit: Payer: Self-pay | Admitting: Family

## 2014-08-03 ENCOUNTER — Encounter: Payer: Self-pay | Admitting: Pediatrics

## 2014-08-03 ENCOUNTER — Ambulatory Visit (INDEPENDENT_AMBULATORY_CARE_PROVIDER_SITE_OTHER): Payer: 59 | Admitting: Pediatrics

## 2014-08-03 VITALS — BP 100/60 | HR 80 | Ht 63.25 in | Wt 126.4 lb

## 2014-08-03 DIAGNOSIS — G40309 Generalized idiopathic epilepsy and epileptic syndromes, not intractable, without status epilepticus: Secondary | ICD-10-CM

## 2014-08-03 DIAGNOSIS — G40209 Localization-related (focal) (partial) symptomatic epilepsy and epileptic syndromes with complex partial seizures, not intractable, without status epilepticus: Secondary | ICD-10-CM

## 2014-08-03 DIAGNOSIS — G43009 Migraine without aura, not intractable, without status migrainosus: Secondary | ICD-10-CM

## 2014-08-03 MED ORDER — SUMATRIPTAN SUCCINATE 100 MG PO TABS: ORAL_TABLET | ORAL | Status: DC

## 2014-08-03 NOTE — Progress Notes (Signed)
Patient: Mary Jacobs MRN: [id number] Sex: female DOB: [birth year]  Provider: Jodi Geralds, MD Location of Care: [facility]  Note type: Routine return visit  History of Present Illness: Referral Source: Dr. [name] History from: mother, patient and [facility] chart Chief Complaint: Migraines/Seizures  Mary Jacobs is a 18 y.o. female who returns August 03, 2014 for the first time since March 14, 2014.  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  Her seizures have been controlled for years on Keppra (trade drug).  She also has migraine without aura that happens somewhere between twice a week and once every other week.  She attends the [employer] and is majoring in [place] in religion.  She enjoys sailing and rock climbing.  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  She believes that some of her headaches are weather related.  When the low-pressure  enters the [place] region, she tends to have more migraines.  Despite all of her headaches, she has missed class only once.  She takes and tolerates sumatriptan, although for the first 30 minutes she feels slow and groggy, which is not uncommon.  She had not remembered this before.  Her symptoms clear within about 30 minutes and her headache does as well.  We have not changed her preventative medicine verapamil or sumatriptan in a longtime.  Mary Jacobs again brought up the question of trying to come off [place].  I have a number of concerns.  When she has been noncompliant, it is not uncommon for her to have a seizure.  This does not bode well for a gradual taper and discontinuation of the medication.  In addition, she will have a semester abroad in the early winter and spring of 2017.  This means that she would need to come off medication by May 2016, so that we had enough time to observe her and feel  comfortable that she was likely to be seizure-free off medication before she leaves the country.    I explained that we would taper her over a six-week and that I wanted her off medication for six months before she was allowed to drive an automobile by herself.  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  I think that it would be a very bad idea to have her come off medication when she was out of the country.  Her mother would be just as happy if Mary Jacobs never attempted to come off the medicine, but I think that it should be given at least one chance at this time.  Mary Jacobs's health has been good.  She is doing very well in school.  She does not appear tired or stressed to me and her examination today was normal.  Review of Systems: 12 system review was unremarkable  Past Medical History Diagnosis Date  . Migraine   . Epilepsy    Hospitalizations: No., Head Injury: No., Nervous System Infections: No., Immunizations up to date: Yes.    Hospitalized at 18 years old due to seizure activity. Seizures were partial onset with secondary generalization. MRI of the brain was normal. EEG showed diffuse slowing as part of a post ictal state. She had onset of migraines  at 67 or 18 years of age. Keppra has completely controlled her seizures, except for times when there have been issues with compliance.  She has taken Zomig with transient benefit. Propranolol was started in 2008 as a preventative medication for migraine increased to 20 mg BId. She has tried Maxalt, Axert, and Relpax without benefit. Imitrex/sumatriptan has been more effective. Atenolol was started in August, 2009 and adjusted to 100 mg. Topiramate was started March, 2011. She reached 50 mg q HS and stopped it in January, 2012 due to cognitive  blunting. Verapamil was started February, 2013.  Birth History 8 pound infant born to a gravida 3 para 2002 woman.  Pregnancy labor and delivery were unremarkable.  Patient went home with her mother.  Growth and development was normal.  Behavior History none  Surgical History Procedure Laterality Date  . Tympanostomy tube placement    . Mirena      Inserted 03-20-13   Family History family history includes Asthma in her maternal grandmother; Breast cancer in her maternal aunt; Cancer in her brother and mother; Migraines in her brother, maternal uncle, and mother; Rectal cancer in her paternal grandfather; Stroke in her maternal grandfather; Thyroid disease in her sister. Family history is negative for seizures, intellectual disabilities, blindness, deafness, birth defects, chromosomal disorder, or autism.  Social History . Marital Status: Single    Spouse Name: N/A    Number of Children: N/A  . Years of Education: N/A   Social History Main Topics  . Smoking status: Never Smoker   . Smokeless tobacco: Never Used  . Alcohol Use: No  . Drug Use: No  . Sexual Activity: Not Currently    Birth Control/ Protection: IUD     Comment: Mirena inserted 03-20-13   Social History Narrative  Educational level university School Attending: [employer] Occupation: Student/Babysitter Living with 4 other students on campus and with parents during break  Hobbies/Interest: Enjoys sailing and climbing  School comments Mary Jacobs is doing fine in college.   No Known Allergies  Physical Exam BP 100/60 mmHg  Pulse 80  Ht 5' 3.25" (1.607 m)  Wt 126 lb 6.4 oz (57.335 kg)  BMI 22.20 kg/m2  General: alert, well developed, well nourished, in no acute distress, blond hair, blue eyes, right handed  Head: normocephalic, no dysmorphic features  Ears, Nose and Throat: Otoscopic: Tympanic membranes normal. Pharynx: oropharynx is pink without exudates or tonsillar hypertrophy.  Neck: supple,  full range of motion, no cranial or cervical bruits  Respiratory: auscultation clear  Cardiovascular: no murmurs, pulses are normal  Musculoskeletal: no skeletal deformities or apparent scoliosis  Skin: no rashes or neurocutaneous lesions  Neurologic Exam  Mental Status: alert; oriented to person, place and year; knowledge is normal for age; language is normal  Cranial Nerves: visual fields are full to double simultaneous stimuli; extraocular movements are full and conjugate; pupils are around reactive to light; funduscopic examination shows sharp disc margins with normal vessels; symmetric facial strength; midline tongue and uvula; air conduction is greater than bone conduction bilaterally.  Motor: Normal strength, tone and mass; good fine motor movements; no pronator drift.  Sensory: intact responses to cold, vibration, proprioception and stereognosis  Coordination: good finger-to-nose, rapid repetitive alternating movements and finger apposition  Gait and Station: normal gait and station: patient is able to walk on heels, toes and tandem without difficulty; balance is adequate; Romberg exam is negative; Gower response is negative  Reflexes: symmetric and diminished bilaterally; no clonus; bilateral flexor plantar  responses.  Assessment 1. Migraine without aura and mention of intractable migraine or status migrainosus, G43.009. 2. Generalized convulsive epilepsy, G40.309. 3. Partial epilepsy with impairment of consciousness, G40.209.  Discussion Latrish's seizures have been well controlled.  Whether or not she can safely come off the medication is unclear.  Her migraines have been less well controlled but are controlled well enough that they have not significantly interfered with her school or her other daily activities.  If she came off medication, I would not want her competitively sailing or rock climbing unless she was secured to a [place] seat and a rope.  I made the point  that the seizure itself would not endanger her, but the activity taking place when the seizure occurred could very well endanger her.  This is why I would not allow her to drive alone.  She will discuss this with her mother and they will make a decision.  I will not participate in a plan to taper her medication after May 2016, knowing that she will be out of the country in the early portion of 2017.  [place] will return in six months for routine visit.  I spent 30 minutes of face-to-face time with [facility] and her mother, more than half of it in consultation.   Medication List   This list is accurate as of: 08/03/14 11:59 PM.       ibuprofen 800 MG tablet  Commonly known as:  ADVIL,MOTRIN  Take 1 tablet (800 mg total) by mouth every 8 (eight) hours as needed for pain.     KEPPRA 500 MG tablet  Generic drug:  levETIRAcetam  TAKE 3 TABLETS BY MOUTH EVERY MORNING AND TAKE 3 TABLETS BY MOUTH AT BEDTIME     sulfamethoxazole-trimethoprim 400-80 MG per tablet  Commonly known as:  BACTRIM,SEPTRA     SUMAtriptan 100 MG tablet  Commonly known as:  IMITREX  TAKE 1 TABLET WITH 400 MG OF IBUPROFEN MAY REPEAT IN 2 HOURS IF HEADACHE PERSISTS OR RECURS.     verapamil 40 MG tablet  Commonly known as:  CALAN  TAKE 2 TABLETS BY MOUTH EVERY MORNING AND 3 TABLETS DAILY AT BEDTIME      The medication list was reviewed and reconciled. All changes or newly prescribed medications were explained.  A complete medication list was provided to the patient/caregiver.  Jodi Geralds MD

## 2014-08-04 ENCOUNTER — Encounter: Payer: Self-pay | Admitting: Pediatrics

## 2014-09-23 ENCOUNTER — Other Ambulatory Visit: Payer: Self-pay | Admitting: Family

## 2014-11-11 ENCOUNTER — Other Ambulatory Visit: Payer: Self-pay | Admitting: Family

## 2014-11-14 ENCOUNTER — Telehealth: Payer: Self-pay | Admitting: Family

## 2014-11-14 DIAGNOSIS — G43009 Migraine without aura, not intractable, without status migrainosus: Secondary | ICD-10-CM

## 2014-11-14 MED ORDER — ELETRIPTAN HYDROBROMIDE 40 MG PO TABS: 40.0000 mg | ORAL_TABLET | ORAL | Status: DC | PRN

## 2014-11-14 NOTE — Telephone Encounter (Signed)
Patient Mary Jacobs left a message saying that she wanted to talk to Dr [name] (only). She can be reached at [phone]. TG

## 2014-11-14 NOTE — Telephone Encounter (Signed)
7 minute phone call with [facility].  She feels that sumatriptan is not helping her headaches and that she has more prolonged side effects of feeling dizzy and fatigued.  I don't know if this is related to her medication or to her migraines.We will try eletriptan (generic Relpax).

## 2015-02-06 ENCOUNTER — Telehealth: Payer: Self-pay | Admitting: *Deleted

## 2015-02-06 NOTE — Telephone Encounter (Signed)
No I have not received any forms.  I don't know if we did this in the form of a letter.  It may just need to be copied and redated.

## 2015-02-06 NOTE — Telephone Encounter (Signed)
Patient called and left voicemail to get form for schools disability center for summer school. Needs documentation sent to [facility] for Disability.   Requested call back when completed: [phone]

## 2015-02-06 NOTE — Telephone Encounter (Signed)
Please let Kimetha know that the document was faxed to [facility] as requested. Thanks, Otila Kluver

## 2015-02-06 NOTE — Telephone Encounter (Signed)
I have not received any forms from [facility] for [place]. Dr [name], have you received anything for her? Otila Kluver

## 2015-02-06 NOTE — Telephone Encounter (Signed)
Signed and returned, thank you

## 2015-02-06 NOTE — Telephone Encounter (Signed)
Patient advised document was faxed to [facility] as requested.

## 2015-02-06 NOTE — Telephone Encounter (Signed)
I received a document from [facility] after receiving this note. I wrote a letter and sent to Dr [name] for approval and signature. TG

## 2015-02-15 ENCOUNTER — Other Ambulatory Visit: Payer: Self-pay | Admitting: Family

## 2015-02-20 ENCOUNTER — Encounter: Payer: Self-pay | Admitting: Pediatrics

## 2015-02-20 ENCOUNTER — Ambulatory Visit (INDEPENDENT_AMBULATORY_CARE_PROVIDER_SITE_OTHER): Payer: 59 | Admitting: Pediatrics

## 2015-02-20 VITALS — BP 98/62 | HR 64 | Ht 63.0 in | Wt 132.8 lb

## 2015-02-20 DIAGNOSIS — G40309 Generalized idiopathic epilepsy and epileptic syndromes, not intractable, without status epilepticus: Secondary | ICD-10-CM

## 2015-02-20 DIAGNOSIS — G43009 Migraine without aura, not intractable, without status migrainosus: Secondary | ICD-10-CM | POA: Diagnosis not present

## 2015-02-20 DIAGNOSIS — G40209 Localization-related (focal) (partial) symptomatic epilepsy and epileptic syndromes with complex partial seizures, not intractable, without status epilepticus: Secondary | ICD-10-CM

## 2015-02-20 MED ORDER — ELETRIPTAN HYDROBROMIDE 40 MG PO TABS: 40.0000 mg | ORAL_TABLET | ORAL | Status: DC | PRN

## 2015-02-20 NOTE — Progress Notes (Signed)
Patient: Mary Jacobs MRN: [id number] Sex: female DOB: 08/27/1995  Provider: Jodi Geralds, MD Location of Care: [facility]  Note type: Routine return visit  History of Present Illness: Referral Source: Dr. [name] History from: patient Chief Complaint: Migraines, Epilepsy  Mary Jacobs is a 19 y.o. female referred for continued evaluation of migraines and epilepsy. She returns today for follow-up.  Since her last visit, Mary Jacobs reports she has been well. She denies any seizure activity and has remained seizure-free for many years. She reports adherence to her prescribed Keppra (trade drug) and has not had any side effects. She continues to desire tapering off of this medication, but is currently debating the appropriate time for this. Ultimately she desires to be off this medication prior to having children, however, she is not currently in a committed relationship and not actively pursuing pregnancy. She has continued to have ongoing discussions about this with her mother who would prefer she continue on this medication indefinitely and has had strong influence in [facility] decision process regarding this in the past.   In regards to her migraines, she continues to have a stable frequency of approximately 3 migraines per week. Her migraines are always triggered by weather changes, mostly summer storms. She is currently taking Verapamil daily for preventative treatment and eletriptan as needed for abortive therapy. She reports the eletriptan is currently effective, however, she continues to have side effects of limb-tingling, fatigue and a sense of feeling disconnected from herself after taking this medication. Despite these side effects, she reports she continues to prefer this medication regimen as it is effective and "what she has to live with".  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 She intends to study abroad in [place]  in 2017 and will leave on 08/12/2015 for [place] for all of 2017. She has not completed her paperwork to maintain her driver's license, but will do so in the clinic today.   She has not acute complaints and otherwise her medical history has not changed.   Review of Systems: 12 system review was unremarkable  Past Medical History Diagnosis Date  . Migraine   . Epilepsy    Hospitalizations: No., Head Injury: No., Nervous System Infections: No., Immunizations up to date: Yes.    Hospitalized at 19 years old due to seizure activity. Seizures were partial onset with secondary generalization. MRI of the brain was normal. EEG showed diffuse slowing as part of a post ictal state. She had onset of migraines at 50 or 19 years of age. Keppra has completely controlled her seizures, except for times when there have been issues with compliance.  She has taken Zomig with transient benefit. Propranolol was started in 2008 as a preventative medication for migraine increased to 20 mg BId. She has tried Maxalt, Axert, and Relpax without benefit. Imitrex/sumatriptan has been more effective. Atenolol was started in August, 2009 and adjusted to 100 mg. Topiramate was started March, 2011. She reached 50 mg q HS and stopped it in January, 2012 due to cognitive blunting. Verapamil was started February, 2013.  Birth History 8 pound infant born to a gravida 3 para 2002 woman.  Pregnancy labor and delivery were unremarkable.  Patient went home with her mother.  Growth and development was normal.  Behavior History none  Surgical History Procedure Laterality Date  . Tympanostomy tube placement    . Mirena      Inserted 03-20-13   Family History family  history includes Asthma in her maternal grandmother; Breast cancer in her maternal aunt; Cancer in her brother and mother; Migraines in her brother, maternal uncle, and mother; Rectal cancer in her paternal grandfather; Stroke in her maternal grandfather; Thyroid  disease in her sister. Family history is negative for migraines, seizures, intellectual disabilities, blindness, deafness, birth defects, chromosomal disorder, or autism.  Social History . Marital Status: Single    Spouse Name: N/A  . Number of Children: N/A  . Years of Education: N/A   Social History Main Topics  . Smoking status: Never Smoker   . Smokeless tobacco: Never Used  . Alcohol Use: No  . Drug Use: No  . Sexual Activity: Not Currently    Birth Control/ Protection: IUD     Comment: Mirena inserted 03-20-13   Social History Narrative   Educational level university School Attending: [employer]   Occupation: Student  Living with both parents and younger sister.    Hobbies/Interest: Some of Mary Jacobs's daily activities include: driving, moving boxes, desk work.  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  She plans to be out of the country in 2017 doing [place] work in [place].  Allergies No Known Allergies  Physical Exam BP 98/62 mmHg  Pulse 64  Ht 5\' 3"  (1.6 m)  Wt 132 lb 12.8 oz (60.238 kg)  BMI 23.53 kg/m2  LMP 02/16/2015 (Within Days)  General: alert, well developed, well nourished, in no acute distress, blonde hair, blue eyes, right handed Head: normocephalic, no dysmorphic features Neck: supple, full range of motion,no cervical lymphadenopathy Respiratory: auscultation clear Cardiovascular: no murmurs, pulses are normal Musculoskeletal: no skeletal deformities Skin: no rashes or neurocutaneous lesions  Neurologic Exam  Mental Status: alert; oriented to person, place and year; knowledge is normal for age; language is normal Cranial Nerves: pupils are round reactive to light;  midline tongue and uvula Motor: Normal strength, tone and mass; good fine motor movements Gait and Station: normal gait and station Reflexes: symmetric and diminished  bilaterally  Assessment 1. Migraine without aura and without status migrainosus, not intractable, G43.001. 2. Generalized convulsive epilepsy, G40.309. 3. Partial epilepsy with impairment of consciousness, G40.209.  Discussion Overall, Mary Jacobs continues to do well. She remains seizure-free and her migraines appear to be at her baseline in frequency and severity and not intractable. Her current medication regimen of Keppra (trade drug) for seizure control and Verapamil as well PRN Eletriptan for migraines is effective, despite some side effects from the Eletriptan, which she is tolerating and does not cause her to desire to change this medication.   She continues to desire to taper off of Keppra, but has not entirely decided when this should be. Lengthy discussion was had with the patient today regarding her desire to study, and eventually work abroad which could making tapering/discontinuing her [place] somewhat more risky given the possibility she may not have easy access to adequate medical care while abroad. It was also discussed that if the patient desires to discontinue her Keppra, she could no longer drive for at least one year given her risk of recurrent seizure activity to which she acknowledged understanding. Ultimately, it was reaffirmed that the patient is now an adult and must come to this decision on her own as Dr. [name] agrees it is worthwhile to consider coming off of Mary Jacobs given her prolonged seizure-free period, to which she agreed.  Plan 1. In regards to  her migraines, she will continue on her Verapamil and Eletriptan. A refill for her Eletriptan was provided today. 2. In regards to her seizures, she will continue to think about when she feels the right time to discontinue Keppra will be and will let Dr. [name] know. She intends to have more discussions with her mother about this as well. She will also look into what her access to medical care as well as obtaining her prescribed  medications will be while she is abroad and report this back to Dr. [name].  3. Forms to maintain her Driver's License were completed today in office, however, if she discontinues Keppra, she has agreed to not drive and will find other means of transportation for one year following this.   Medication List   This list is accurate as of: 02/20/15 11:59 PM.       eletriptan 40 MG tablet  Commonly known as:  RELPAX  Take 1 tablet (40 mg total) by mouth as needed for migraine or headache. May repeat in 2 hours if headache persists or recurs.     ibuprofen 800 MG tablet  Commonly known as:  ADVIL,MOTRIN  Take 1 tablet (800 mg total) by mouth every 8 (eight) hours as needed for pain.     KEPPRA 500 MG tablet  Generic drug:  levETIRAcetam  TAKE 3 TABLETS BY MOUTH IN THE MORNING AND TAKE 3 TABLETS AT BEDTIME     SUMAtriptan 100 MG tablet  Commonly known as:  IMITREX  TAKE 1 TABLET WITH 400 MG OF IBUPROFEN MAY REPEAT IN 2 HOURS IF HEADACHE PERSISTS OR RECURS.     verapamil 40 MG tablet  Commonly known as:  CALAN  TAKE 2 TABLETS BY MOUTH EVERY MORNING AND 3 TABLETS DAILY AT BEDTIME      The medication list was reviewed and reconciled. All changes or newly prescribed medications were explained.  A complete medication list was provided to the patient/caregiver.  Marlise Eves, MD Internal Medicine/Pediatrics, PGY-4  15 minutes of face-to-face time was spent with [facility], more than half of it in consultation.  I performed physical examination, participated in history taking, and guided decision making.  Jodi Geralds MD

## 2015-02-22 ENCOUNTER — Encounter: Payer: Self-pay | Admitting: Women's Health

## 2015-02-22 ENCOUNTER — Ambulatory Visit (INDEPENDENT_AMBULATORY_CARE_PROVIDER_SITE_OTHER): Payer: 59 | Admitting: Women's Health

## 2015-02-22 VITALS — BP 104/68 | Ht 63.25 in | Wt 131.0 lb

## 2015-02-22 DIAGNOSIS — Z01419 Encounter for gynecological examination (general) (routine) without abnormal findings: Secondary | ICD-10-CM | POA: Diagnosis not present

## 2015-02-22 LAB — CBC WITH DIFFERENTIAL/PLATELET
Basophils Absolute: 0.1 10*3/uL (ref 0.0–0.1)
Basophils Relative: 1 % (ref 0–1)
Eosinophils Absolute: 0.3 10*3/uL (ref 0.0–0.7)
Eosinophils Relative: 5 % (ref 0–5)
HEMATOCRIT: 43 % (ref 36.0–46.0)
Hemoglobin: 13.9 g/dL (ref 12.0–15.0)
LYMPHS ABS: 1.7 10*3/uL (ref 0.7–4.0)
LYMPHS PCT: 33 % (ref 12–46)
MCH: 28.4 pg (ref 26.0–34.0)
MCHC: 32.3 g/dL (ref 30.0–36.0)
MCV: 87.8 fL (ref 78.0–100.0)
MPV: 10.7 fL (ref 8.6–12.4)
Monocytes Absolute: 0.5 10*3/uL (ref 0.1–1.0)
Monocytes Relative: 10 % (ref 3–12)
NEUTROS ABS: 2.7 10*3/uL (ref 1.7–7.7)
Neutrophils Relative %: 51 % (ref 43–77)
Platelets: 271 10*3/uL (ref 150–400)
RBC: 4.9 MIL/uL (ref 3.87–5.11)
RDW: 15.1 % (ref 11.5–15.5)
WBC: 5.2 10*3/uL (ref 4.0–10.5)

## 2015-02-22 NOTE — Patient Instructions (Signed)

## 2015-02-22 NOTE — Progress Notes (Signed)
Mary Jacobs [birth year] [id number]    History:    Presents for annual exam.  Rare bleeding Mirena IUD placed 03/2013. [place]. Gardasil series completed. Epilepsy seizure-free on Keppra Dr. [name] manages. History of migraines on Relpax.  Past medical history, past surgical history, family history and social history were all reviewed and documented in the EPIC chart. 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 Planning  a semester abroad in [place].  ROS:  A ROS was performed and pertinent positives and negatives are included.  Exam:  Filed Vitals:   02/22/15 0839  BP: 104/68    General appearance:  Normal Thyroid:  Symmetrical, normal in size, without palpable masses or nodularity. Respiratory  Auscultation:  Clear without wheezing or rhonchi Cardiovascular  Auscultation:  Regular rate, without rubs, murmurs or gallops  Edema/varicosities:  Not grossly evident Abdominal  Soft,nontender, without masses, guarding or rebound.  Liver/spleen:  No organomegaly noted  Hernia:  None appreciated  Skin  Inspection:  Grossly normal   Breasts: Examined lying and sitting.     Right: Without masses, retractions, discharge or axillary adenopathy.     Left: Without masses, retractions, discharge or axillary adenopathy. Gentitourinary   Inguinal/mons:  Normal without inguinal adenopathy  External genitalia:  Normal  BUS/Urethra/Skene's glands:  Normal  Vagina:  Normal  Cervix:  Normal IUD strings visible  Uterus: normal in size, shape and contour.  Midline and mobile  Adnexa/parametria:     Rt: Without masses or tenderness.   Lt: Without masses or tenderness.  Anus and perineum: Normal    Assessment/Plan:  19 y.o. S [place] virgin for annual exam.    03/2013 Mirena IUD-amenorrhea Epilepsy and migraines Dr. [name] manages  Plan: SBE's, regular exercise, calcium rich diet, MVI daily encouraged. Campus safety reviewed. Continue healthy lifestyle. CBC, UA. Condoms encouraged if  become sexually active.   [name] [facility], 12:50 PM 02/22/2015

## 2015-02-23 ENCOUNTER — Encounter: Payer: Self-pay | Admitting: Pediatrics

## 2015-03-01 ENCOUNTER — Telehealth: Payer: Self-pay

## 2015-03-01 NOTE — Telephone Encounter (Signed)
Patient called in voice mail stating she has urology appt in a couple of weeks.  She has been having problems completely emptying bladder and said it takes her 3 times to empty her bladder.  She said she has been dehydrated.  She said she has some questions.   Left message for her to call me in her voice mail.

## 2015-03-01 NOTE — Telephone Encounter (Signed)
Patient has appt on 03/12/15 to see urologist. She said she feels like she is not emptying bladder completely when she urinates. She said she will go and then have to go three times in an hour to feel like she has emptied it.  No UTI symptoms at all.  She said she also has noticed she is "dehydrated all the time".  Upon questioning she is actually thirsty all the time. She said her lips even get dry and her fingers are very dry.  She wondered if being "dehydrated" could be causing the urinary symptoms.  She knows [place] is off and it will be sometime next week before we call her back.

## 2015-03-04 NOTE — Telephone Encounter (Signed)
Scheduled to return to school in [facility] July 27 questioning if we can get her and early her appointment, unsure of doctors name will call back tomorrow with name of physician. Last 6 weeks increase frequency with urgency nocturia 4 times. States had rare or nocturia 1. States has had in complaint emptying feeling along with frequency.

## 2015-03-04 NOTE — Telephone Encounter (Signed)
Message left

## 2015-03-06 ENCOUNTER — Telehealth: Payer: Self-pay | Admitting: *Deleted

## 2015-03-06 NOTE — Telephone Encounter (Signed)
-----   Message from [name], NP sent at 03/05/2015  1:40 PM EDT -----  She has an appointment at  [facility] urology for July 26 has been seen there in the past. Having a problem with urinary frequency and incomplete emptying feeling without UTI. Nocturia times 4. 2 returns to school July 28 and was wondering if we could get her an appointment earlier. She is not sure which doctor she has the appointment with just knows that is at [facility] urology.

## 2015-03-06 NOTE — Telephone Encounter (Signed)
I called [facility] and asked about the below and was told no sooner appointment, she is a new patient. I called pt and  Informed her the was the next available  appointment.

## 2015-03-08 NOTE — Telephone Encounter (Signed)
No appointments available, patient notified will keep scheduled appointment

## 2015-03-20 ENCOUNTER — Other Ambulatory Visit: Payer: Self-pay | Admitting: Family

## 2015-03-22 ENCOUNTER — Telehealth: Payer: Self-pay | Admitting: *Deleted

## 2015-03-22 NOTE — Telephone Encounter (Signed)
Called and left voicemail for patient letting her know that Dr. [name] would be out of the office today and next week but the conversation can be made the week of August 15th. I invited her to call me back if she needed to discuss further with someone else or had questions or concerns.

## 2015-03-22 NOTE — Telephone Encounter (Signed)
Mary Jacobs called and is requesting a three way call be made with her and her mother regarding her medications and coming off of them.

## 2015-04-01 NOTE — Telephone Encounter (Signed)
I reached Mary Jacobs, but she was busy and asked me to call her tomorrow.

## 2015-04-02 NOTE — Telephone Encounter (Signed)
I left a message that I called.  I told the patient to call back when she is able.

## 2015-04-05 NOTE — Telephone Encounter (Signed)
I left a message that the patient should call back.

## 2015-04-08 NOTE — Telephone Encounter (Signed)
Peni called stated that she has been working a lot and has been unable to answer your call. Please call back when you can [phone].

## 2015-04-09 NOTE — Telephone Encounter (Signed)
The patient needs to specify a time when she will pick up the phone.

## 2015-04-10 NOTE — Telephone Encounter (Signed)
Ranette called and left a voicemail stating that due to her inability to answer your phone calls she will talk to you on her next follow up.

## 2015-08-21 ENCOUNTER — Telehealth: Payer: Self-pay | Admitting: *Deleted

## 2015-08-21 ENCOUNTER — Ambulatory Visit (INDEPENDENT_AMBULATORY_CARE_PROVIDER_SITE_OTHER): Payer: 59 | Admitting: Pediatrics

## 2015-08-21 ENCOUNTER — Encounter: Payer: Self-pay | Admitting: Pediatrics

## 2015-08-21 VITALS — BP 106/64 | HR 96 | Ht 63.0 in | Wt 130.4 lb

## 2015-08-21 DIAGNOSIS — Z298 Encounter for other specified prophylactic measures: Secondary | ICD-10-CM

## 2015-08-21 DIAGNOSIS — G40309 Generalized idiopathic epilepsy and epileptic syndromes, not intractable, without status epilepticus: Secondary | ICD-10-CM | POA: Diagnosis not present

## 2015-08-21 DIAGNOSIS — G43009 Migraine without aura, not intractable, without status migrainosus: Secondary | ICD-10-CM | POA: Diagnosis not present

## 2015-08-21 DIAGNOSIS — Z418 Encounter for other procedures for purposes other than remedying health state: Secondary | ICD-10-CM

## 2015-08-21 DIAGNOSIS — G40209 Localization-related (focal) (partial) symptomatic epilepsy and epileptic syndromes with complex partial seizures, not intractable, without status epilepticus: Secondary | ICD-10-CM

## 2015-08-21 MED ORDER — DOXYCYCLINE HYCLATE 100 MG PO CAPS: ORAL_CAPSULE | ORAL | Status: DC

## 2015-08-21 MED ORDER — VERAPAMIL HCL 40 MG PO TABS: ORAL_TABLET | ORAL | Status: DC

## 2015-08-21 MED ORDER — KEPPRA 500 MG PO TABS: ORAL_TABLET | ORAL | Status: DC

## 2015-08-21 MED ORDER — ELETRIPTAN HYDROBROMIDE 40 MG PO TABS: 40.0000 mg | ORAL_TABLET | ORAL | Status: DC | PRN

## 2015-08-21 NOTE — Progress Notes (Signed)
Patient: Mary Jacobs MRN: [id number] Sex: female DOB: 1995-11-04  Provider: Jodi Geralds, MD Location of Care: [facility]rology  Note type: Routine return visit  History of Present Illness: Referral Source: [name], MD History from: patient and [facility] chart Chief Complaint: Migraines/Epilepsy  Mary Jacobs is a 20 y.o. female who was evaluated on August 21, 2015 for the first time since February 20, 2015.  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  She has migraine without aura and episodic tension-type headaches.  She will travel to [place] to study in [place] within the next two weeks.  She expects to be there for six months.  She tells me that when she is at school at [employer] that she has experienced only four or five migraines this semester.  However, since she came home she has experienced daily headaches and has taken Relpax on five occasions.  She had a change in sleep and wake pattern, her diet, and apparently there was a death of a close relative that has been very stressful for all family members.  When she is at school, she exercises on a daily basis.  She has not done that at home.  Despite the fact that her headaches have been problematic over the past couple of weeks, she remains seizure-free and has been for many years.  Her mother has worked out the logistics so that she can obtain six months of Keppra, verapamil, and eletriptan.  In addition, she asked me to prescribe a month's worth of doxycycline to go with another prescription that she has received.  This is for prophylaxis against malaria.  Apparently she is not able to take typical antimalarial agents because of her other medications.  She intends to study a variety of religions and contemporary [place] history.  This is done an affiliation with the [employer], but the tuition is so much less than she is charged at her school, that she will be able to  afford the entire trip for less than what would have cost her to remain in [place] for this term.  She will be living with a roommate in [place].  She does not know who this is.  That is the source of some concern.  There was some confusion in that she thought she was taking atenolol and she has not been on that medicine since 2014.  Her health has been good.  In general she is sleeping well, although she stated she has not been taking good care of herself since she came home.  Hopefully she will do better when she travels to [place].  Review of Systems: 12 system review was unremarkable  Past Medical History History reviewed. No pertinent past medical history. Hospitalizations: No., Head Injury: No., Nervous System Infections: No., Immunizations up to date: Yes.    Hospitalized at 20 years old due to seizure activity. Seizures were partial onset with secondary generalization. MRI of the brain was normal. EEG showed diffuse slowing as part of a post ictal state. She had onset of migraines at 63 or 20 years of age. Keppra has completely controlled her seizures, except for times when there have been issues with compliance.  She has taken Zomig with transient benefit. Propranolol was started in 2008 as a preventative medication for migraine increased to 20 mg BId. She has tried Maxalt, Axert, and Relpax without benefit. Imitrex/sumatriptan has been more effective. Atenolol was started in August, 2009 and adjusted to 100 mg. Topiramate  was started March, 2011. She reached 50 mg q HS and stopped it in January, 2012 due to cognitive blunting. Verapamil was started February, 2013.  Birth History 8 pound infant born to a gravida 3 para 2002 woman.  Pregnancy labor and delivery were unremarkable.  Patient went home with her mother.  Growth and development was normal.  Behavior History none  Surgical History Procedure Laterality Date  . Tympanostomy tube placement    . Mirena      Inserted  03-20-13   Family History family history includes Asthma in her maternal grandmother; Breast cancer in her maternal aunt; Cancer in her brother and mother; Migraines in her brother, maternal uncle, and mother; Rectal cancer in her paternal grandfather; Stroke in her maternal grandfather; Thyroid disease in her sister. Family history is negative for seizures, intellectual disabilities, blindness, deafness, birth defects, chromosomal disorder, or autism.  Social History . Marital Status: Single    Spouse Name: N/A  . Number of Children: N/A  . Years of Education: N/A   Social History Main Topics  . Smoking status: Never Smoker   . Smokeless tobacco: Never Used  . Alcohol Use: No  . Drug Use: No  . 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 She lives with roommates. She enjoys exercising and reading.   No Known Allergies  Physical Exam BP 106/64 mmHg  Pulse 96  Ht 5\' 3"  (1.6 m)  Wt 130 lb 6.4 oz (59.149 kg)  BMI 23.11 kg/m2  General: alert, well developed, well nourished, in no acute distress, blond hair, blue eyes, right handed Head: normocephalic, no dysmorphic features Ears, Nose and Throat: Otoscopic: tympanic membranes normal; pharynx: oropharynx is pink without exudates or tonsillar hypertrophy Neck: supple, full range of motion, no cranial or cervical bruits Respiratory: auscultation clear Cardiovascular: no murmurs, pulses are normal Musculoskeletal: no skeletal deformities or apparent scoliosis Skin: no rashes or neurocutaneous lesions  Neurologic Exam  Mental Status: alert; oriented to person, place and year; knowledge is normal for age; language is normal Cranial Nerves: visual fields are full to double simultaneous stimuli; extraocular movements are full and conjugate; pupils are  round reactive to light; funduscopic examination shows sharp disc margins with normal vessels; symmetric facial strength; midline tongue and uvula; air conduction is greater than bone conduction bilaterally Motor: Normal strength, tone and mass; good fine motor movements; no pronator drift Sensory: intact responses to cold, vibration, proprioception and stereognosis Coordination: good finger-to-nose, rapid repetitive alternating movements and finger apposition Gait and Station: normal gait and station: patient is able to walk on heels, toes and tandem without difficulty; balance is adequate; Romberg exam is negative; Gower response is negative Reflexes: symmetric and diminished bilaterally; no clonus; bilateral flexor plantar responses  Assessment 1. Migraine without aura without status migrainosus, not intractable, G43.009. 2. Generalized convulsive epilepsy, G40.309. 3. Partial epilepsy with impairment of consciousness, G40.209. 4. Need for prophylaxis against Malaria, Z41.8.  Discussion I think it is highly unlikely that [facility] will have problems with seizures as long as she is compliant with her medication.  I am not certain how she will deal with her headaches.  She does seem to organize herself better when she is at school than at home.  Plan She will return to see me in  about six months' time when she returns to the [place].  I asked her to keep touch with me concerning her health particularly if there are problems as regards migraines or seizures.  I spent 30 minutes of face-to-face time with [facility], more than half of it in consultation.  Medication List   This list is accurate as of: 08/21/15 11:59 PM.       doxycycline 100 MG capsule  Commonly known as:  VIBRAMYCIN  Take 1 capsule by mouth daily     eletriptan 40 MG tablet  Commonly known as:  RELPAX  Take 1 tablet (40 mg total) by mouth as needed for migraine or headache. May repeat in 2 hours if headache persists or  recurs.     ibuprofen 800 MG tablet  Commonly known as:  ADVIL,MOTRIN  Take 1 tablet (800 mg total) by mouth every 8 (eight) hours as needed for pain.     KEPPRA 500 MG tablet  Generic drug:  levETIRAcetam  TAKE 3 TABLETS BY MOUTH IN THE MORNING AND TAKE 3 TABLETS AT BEDTIME     verapamil 40 MG tablet  Commonly known as:  CALAN  TAKE 3 TABLETS BY MOUTH EVERY MORNING AND 3 TABLETS DAILY AT BEDTIME      The medication list was reviewed and reconciled. All changes or newly prescribed medications were explained.  A complete medication list was provided to the patient/caregiver.  Jodi Geralds MD

## 2015-08-21 NOTE — Telephone Encounter (Signed)
Mary Jacobs's mother emailed the following:  Hi  Mary Jacobs saw Dr. [name] today for her regular check up, and she got prescriptions to last her during her semester abroad.  To travel with that quantity of medicine, Mary Jacobs should probably have a letter from Dr. [name] to carry with her indicating the medicines she has prescriptions for, and that they are for her medical conditions of epilepsy and migraines.  Can that be prepared, and Mary Jacobs can come pick it up once it is.  Thanks - Mary Jacobs [facility]

## 2015-08-21 NOTE — Telephone Encounter (Signed)
Mother notified through email as requested, that letter is ready for pick up.

## 2015-08-21 NOTE — Telephone Encounter (Signed)
Dictated and placed on your desk, please contact the family.

## 2015-11-17 ENCOUNTER — Other Ambulatory Visit: Payer: Self-pay | Admitting: Pediatrics

## 2015-11-21 ENCOUNTER — Other Ambulatory Visit: Payer: Self-pay | Admitting: Pediatrics

## 2016-01-16 ENCOUNTER — Telehealth: Payer: Self-pay | Admitting: *Deleted

## 2016-01-16 NOTE — Telephone Encounter (Signed)
Pt just returned from [place] from [place] aboard asked if she needs to have any blood work done since she is back in the states now? Please advise

## 2016-01-20 ENCOUNTER — Other Ambulatory Visit: Payer: Self-pay | Admitting: Women's Health

## 2016-01-20 DIAGNOSIS — Z789 Other specified health status: Secondary | ICD-10-CM

## 2016-01-20 NOTE — Telephone Encounter (Signed)
TC has recently returned from semester in [place], denies fever, diarrhea, myalgias, jt or muscle pain or headache.  Did not take malaria prevention meds.(Will check to see if able to check for malaria exposure in a blood test) Has not been ill currently having cold symptoms that are improving.

## 2016-01-20 NOTE — Telephone Encounter (Signed)
Talked with the lab will check malaria antibodies to check for exposure. Aware of need to sched lab appt

## 2016-01-22 ENCOUNTER — Other Ambulatory Visit: Payer: 59

## 2016-01-22 DIAGNOSIS — Z789 Other specified health status: Secondary | ICD-10-CM

## 2016-01-26 LAB — TRYPANOSOMA CRUZI ANTIBODY, TOTAL: T. cruzi antibody, total: NONREACTIVE

## 2016-01-28 ENCOUNTER — Other Ambulatory Visit: Payer: Self-pay | Admitting: Gynecology

## 2016-01-28 ENCOUNTER — Other Ambulatory Visit: Payer: 59

## 2016-02-03 LAB — MALARIA SMEAR

## 2016-02-20 ENCOUNTER — Encounter: Payer: Self-pay | Admitting: Pediatrics

## 2016-02-20 ENCOUNTER — Ambulatory Visit (INDEPENDENT_AMBULATORY_CARE_PROVIDER_SITE_OTHER): Payer: 59 | Admitting: Pediatrics

## 2016-02-20 VITALS — BP 110/70 | HR 72 | Ht 63.0 in | Wt 127.8 lb

## 2016-02-20 DIAGNOSIS — G40209 Localization-related (focal) (partial) symptomatic epilepsy and epileptic syndromes with complex partial seizures, not intractable, without status epilepticus: Secondary | ICD-10-CM

## 2016-02-20 DIAGNOSIS — G40309 Generalized idiopathic epilepsy and epileptic syndromes, not intractable, without status epilepticus: Secondary | ICD-10-CM | POA: Diagnosis not present

## 2016-02-20 DIAGNOSIS — G43009 Migraine without aura, not intractable, without status migrainosus: Secondary | ICD-10-CM

## 2016-02-20 MED ORDER — KEPPRA 500 MG PO TABS: ORAL_TABLET | ORAL | Status: DC

## 2016-02-20 MED ORDER — ELETRIPTAN HYDROBROMIDE 40 MG PO TABS: 40.0000 mg | ORAL_TABLET | ORAL | Status: DC | PRN

## 2016-02-20 MED ORDER — VERAPAMIL HCL 40 MG PO TABS: ORAL_TABLET | ORAL | Status: DC

## 2016-02-20 NOTE — Patient Instructions (Signed)
Please sign up for My Chart and communicate with me when you want to make changes in your medication.

## 2016-02-20 NOTE — Progress Notes (Signed)
Patient: Mary Jacobs MRN: [id number] Sex: female DOB: 21-Jun-1996  Provider: Jodi Geralds, MD Location of Care: [facility]rology  Note type: Routine return visit  History of Present Illness: Referral Source: [name], MD History from: patient and [facility] chart Chief Complaint: Migraines/Epilepsy  Mary Jacobs is a 20 y.o. female who returns February 20, 2016 for the first time since August 21, 2015.  [name] has partial onset seizures with secondary generalization.  She also has migraine without aura.  [name] has been seizure-free since September 20, 2011 when she forgot to take her nighttime medicine dose.  She has been slowly tapering Keppra on her own despite warnings from me that recurrent seizures could occur as a result of this behavior.  She wants to get off of medication and thus far has been fine.  The slow taper may allow [place] over time to reach a point where we attempt a more rapid taper, but simultaneously prevent her from driving an automobile.  [name] also has migraine without aura and says that she cannot remember the last time that she had a headache, which is the best headache control that she has had since I saw her in 2008.  Her history of use of preventative and abortive medications is recounted in past medical history.  She has been tapering verapamil as well.  Unfortunately, this has gone well and the ramifications of breakthrough headaches are much less than recurrent seizures.  She spent the past six months studying politics and religion in [place] and traveling.  She learned a little Hindi.  Her health is good.  She is preparing for her senior year at college of [employer].  This summer she is spending time at the [employer], working on the organizational aspects of the business.  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  She has majored in international business and should be a very  attractive candidate.  At some point, she wants to resume her education; however, I think it is a good idea for her to take time off and have practical experience in the world before she resumes her studies.  Her health is good.  She is sleeping well.  Her weight is slightly down from last visit.  She had no other concerns today except discussing the slow taper of her medications.  Review of Systems: 12 system review was assessed and was negative  Past Medical History History reviewed. No pertinent past medical history. Hospitalizations: No., Head Injury: No., Nervous System Infections: No., Immunizations up to date: Yes.    Hospitalized at 20 years old due to seizure activity. Seizures were partial onset with secondary generalization. MRI of the brain was normal. EEG showed diffuse slowing as part of a post ictal state. She had onset of migraines at 73 or 20 years of age. Keppra has completely controlled her seizures, except for times when there have been issues with compliance.  She has taken Zomig with transient benefit. Propranolol was started in 2008 as a preventative medication for migraine increased to 20 mg BId. She has tried Maxalt, Axert, and Relpax without benefit. Imitrex/sumatriptan has been more effective. Atenolol was started in August, 2009 and adjusted to 100 mg. Topiramate was started March, 2011. She reached 50 mg q HS and stopped it in January, 2012 due to cognitive blunting. Verapamil was started February, 2013.  Birth History 8 pound infant born to a gravida 3  para 2002 woman.  Pregnancy labor and delivery were unremarkable.  Patient went home with her mother.  Growth and development was normal.  Behavior History none  Surgical History Procedure Laterality Date  . Tympanostomy tube placement    . Mirena      Inserted 03-20-13   Family History family history includes Asthma in her maternal grandmother; Breast cancer in her maternal aunt; Cancer in her brother and  mother; Migraines in her brother, maternal uncle, and mother; Rectal cancer in her paternal grandfather; Stroke in her maternal grandfather; Thyroid disease in her sister. Family history is negative for migraines, seizures, intellectual disabilities, blindness, deafness, birth defects, chromosomal disorder, or autism.  Social History . Marital Status: Single    Spouse Name: N/A  . Number of Children: N/A  . Years of Education: N/A   Social History Main Topics  . Smoking status: Never Smoker   . Smokeless tobacco: Never Used  . Alcohol Use: No  . Drug Use: No  . 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 She lives with roommates when she is at school. She enjoys exercising and reading.   No Known Allergies  Physical Exam BP 110/70 mmHg  Pulse 72  Ht 5\' 3"  (1.6 m)  Wt 127 lb 12.8 oz (57.97 kg)  BMI 22.64 kg/m2  General: alert, well developed, well nourished, in no acute distress, blond hair, blue eyes, right handed Head: normocephalic, no dysmorphic features Ears, Nose and Throat: Otoscopic: tympanic membranes normal; pharynx: oropharynx is pink without exudates or tonsillar hypertrophy Neck: supple, full range of motion, no cranial or cervical bruits Respiratory: auscultation clear Cardiovascular: no murmurs, pulses are normal Musculoskeletal: no skeletal deformities or apparent scoliosis Skin: no rashes or neurocutaneous lesions  Neurologic Exam  Mental Status: alert; oriented to person, place and year; knowledge is normal for age; language is normal Cranial Nerves: visual fields are full to double simultaneous stimuli; extraocular movements are full and conjugate; pupils are round reactive to light; funduscopic examination shows sharp disc margins with normal vessels; symmetric facial strength;  midline tongue and uvula; air conduction is greater than bone conduction bilaterally Motor: Normal strength, tone and mass; good fine motor movements; no pronator drift Sensory: intact responses to cold, vibration, proprioception and stereognosis Coordination: good finger-to-nose, rapid repetitive alternating movements and finger apposition Gait and Station: normal gait and station: patient is able to walk on heels, toes and tandem without difficulty; balance is adequate; Romberg exam is negative; Gower response is negative Reflexes: symmetric and diminished bilaterally; no clonus; bilateral flexor plantar responses  Assessment 1. Migraine without aura and without status migrainosus, not intractable, G43.009. 2. Epilepsy, generalized, convulsive, G40.309. 3. Partial epilepsy with impairment of consciousness, G40.209.  Discussion I am pleased that Tanina is doing so well.  I am concerned about continuing to taper her medications while she drives.  There are risks associated with that are relatively low.  The last event in February 2013 was a nocturnal one.  Nearly all her seizures have been nocturnal which makes risks of consequences of recurrence considerably lower than they might be for some other patient.  Plan Prescriptions are issued for Keppra, verapamil, and eletriptan.  She will take two tablets twice daily of Keppra 500 mg and two tablets twice daily of verapamil  40 mg.  She has not needed eletriptan in quite some time.  I asked her to sign up for My Chart and to contact me in about three months.  If she has had no problems in tapering her medication over three months, we will attempt to drop it further.    At some point, I think that we should intentionally taper her medication to see if we can get her off of it her antiepileptic medicine before she enters a world where she has to drive to get to and from home in a job.  I spent 30 minutes of face-to-face time with [facility].  She will return  to see me in six months' time.   Medication List   This list is accurate as of: 02/20/16 11:16 AM.       doxycycline 100 MG capsule  Commonly known as:  VIBRAMYCIN  Take 1 capsule by mouth daily     eletriptan 40 MG tablet  Commonly known as:  RELPAX  Take 1 tablet (40 mg total) by mouth as needed for migraine or headache. May repeat in 2 hours if headache persists or recurs.     ibuprofen 800 MG tablet  Commonly known as:  ADVIL,MOTRIN  Take 1 tablet (800 mg total) by mouth every 8 (eight) hours as needed for pain.     KEPPRA 500 MG tablet  Generic drug:  levETIRAcetam  TAKE 2 TABLETS BY MOUTH IN THE MORNING AND 2 TABLETS AT BEDTIME     verapamil 40 MG tablet  Commonly known as:  CALAN  TAKE 2 TABLETS IN THE MORNING AND 2 TABLETS AT BEDTIME      The medication list was reviewed and reconciled. All changes or newly prescribed medications were explained.  A complete medication list was provided to the patient/caregiver.  Jodi Geralds MD

## 2016-02-25 ENCOUNTER — Ambulatory Visit (INDEPENDENT_AMBULATORY_CARE_PROVIDER_SITE_OTHER): Payer: 59 | Admitting: Women's Health

## 2016-02-25 ENCOUNTER — Encounter: Payer: Self-pay | Admitting: Women's Health

## 2016-02-25 VITALS — BP 122/80 | Ht 63.0 in | Wt 128.0 lb

## 2016-02-25 DIAGNOSIS — Z1329 Encounter for screening for other suspected endocrine disorder: Secondary | ICD-10-CM

## 2016-02-25 DIAGNOSIS — B3731 Acute candidiasis of vulva and vagina: Secondary | ICD-10-CM

## 2016-02-25 DIAGNOSIS — A499 Bacterial infection, unspecified: Secondary | ICD-10-CM

## 2016-02-25 DIAGNOSIS — B373 Candidiasis of vulva and vagina: Secondary | ICD-10-CM

## 2016-02-25 DIAGNOSIS — B9689 Other specified bacterial agents as the cause of diseases classified elsewhere: Secondary | ICD-10-CM

## 2016-02-25 DIAGNOSIS — N760 Acute vaginitis: Secondary | ICD-10-CM | POA: Diagnosis not present

## 2016-02-25 DIAGNOSIS — Z01419 Encounter for gynecological examination (general) (routine) without abnormal findings: Secondary | ICD-10-CM | POA: Diagnosis not present

## 2016-02-25 LAB — COMPREHENSIVE METABOLIC PANEL
ALBUMIN: 4.4 g/dL (ref 3.6–5.1)
ALT: 13 U/L (ref 6–29)
AST: 16 U/L (ref 10–30)
Alkaline Phosphatase: 54 U/L (ref 33–115)
BUN: 15 mg/dL (ref 7–25)
CHLORIDE: 103 mmol/L (ref 98–110)
CO2: 19 mmol/L — ABNORMAL LOW (ref 20–31)
Calcium: 9.1 mg/dL (ref 8.6–10.2)
Creat: 0.77 mg/dL (ref 0.50–1.10)
Glucose, Bld: 77 mg/dL (ref 65–99)
Potassium: 4.2 mmol/L (ref 3.5–5.3)
SODIUM: 138 mmol/L (ref 135–146)
TOTAL PROTEIN: 6.9 g/dL (ref 6.1–8.1)
Total Bilirubin: 0.6 mg/dL (ref 0.2–1.2)

## 2016-02-25 LAB — CBC WITH DIFFERENTIAL/PLATELET
BASOS ABS: 132 {cells}/uL (ref 0–200)
Basophils Relative: 2 %
Eosinophils Absolute: 264 cells/uL (ref 15–500)
Eosinophils Relative: 4 %
HEMATOCRIT: 40.9 % (ref 35.0–45.0)
HEMOGLOBIN: 13.2 g/dL (ref 11.7–15.5)
LYMPHS ABS: 2244 {cells}/uL (ref 850–3900)
Lymphocytes Relative: 34 %
MCH: 28 pg (ref 27.0–33.0)
MCHC: 32.3 g/dL (ref 32.0–36.0)
MCV: 86.8 fL (ref 80.0–100.0)
MONO ABS: 660 {cells}/uL (ref 200–950)
MPV: 10.7 fL (ref 7.5–12.5)
Monocytes Relative: 10 %
NEUTROS ABS: 3300 {cells}/uL (ref 1500–7800)
NEUTROS PCT: 50 %
Platelets: 276 10*3/uL (ref 140–400)
RBC: 4.71 MIL/uL (ref 3.80–5.10)
RDW: 14 % (ref 11.0–15.0)
WBC: 6.6 10*3/uL (ref 3.8–10.8)

## 2016-02-25 LAB — WET PREP FOR TRICH, YEAST, CLUE
Trich, Wet Prep: NONE SEEN
YEAST WET PREP: NONE SEEN

## 2016-02-25 LAB — TSH: TSH: 0.89 mIU/L

## 2016-02-25 MED ORDER — FLUCONAZOLE 150 MG PO TABS: 150.0000 mg | ORAL_TABLET | Freq: Once | ORAL | Status: DC

## 2016-02-25 MED ORDER — METRONIDAZOLE 0.75 % VA GEL: VAGINAL | Status: DC

## 2016-02-25 NOTE — Patient Instructions (Addendum)
Health Maintenance, Female Adopting a healthy lifestyle and getting preventive care can go a long way to promote health and wellness. Talk with your health care provider about what schedule of regular examinations is right for you. This is a good chance for you to check in with your provider about disease prevention and staying healthy. In between checkups, there are plenty of things you can do on your own. Experts have done a lot of research about which lifestyle changes and preventive measures are most likely to keep you healthy. Ask your health care provider for more information. WEIGHT AND DIET  Eat a healthy diet  Be sure to include plenty of vegetables, fruits, low-fat dairy products, and lean protein.  Do not eat a lot of foods high in solid fats, added sugars, or salt.  Get regular exercise. This is one of the most important things you can do for your health.  Most adults should exercise for at least 150 minutes each week. The exercise should increase your heart rate and make you sweat (moderate-intensity exercise).  Most adults should also do strengthening exercises at least twice a week. This is in addition to the moderate-intensity exercise.  Maintain a healthy weight  Body mass index (BMI) is a measurement that can be used to identify possible weight problems. It estimates body fat based on height and weight. Your health care provider can help determine your BMI and help you achieve or maintain a healthy weight.  For females 20 years of age and older:   A BMI below 18.5 is considered underweight.  A BMI of 18.5 to 24.9 is normal.  A BMI of 25 to 29.9 is considered overweight.  A BMI of 30 and above is considered obese.  Watch levels of cholesterol and blood lipids  You should start having your blood tested for lipids and cholesterol at 22 years of age, then have this test every 5 years.  You may need to have your cholesterol levels checked more often if:  Your lipid  or cholesterol levels are high.  You are older than 22 years of age.  You are at high risk for heart disease.  CANCER SCREENING   Lung Cancer  Lung cancer screening is recommended for adults 55-80 years old who are at high risk for lung cancer because of a history of smoking.  A yearly low-dose CT scan of the lungs is recommended for people who:  Currently smoke.  Have quit within the past 15 years.  Have at least a 30-pack-year history of smoking. A pack year is smoking an average of one pack of cigarettes a day for 1 year.  Yearly screening should continue until it has been 15 years since you quit.  Yearly screening should stop if you develop a health problem that would prevent you from having lung cancer treatment.  Breast Cancer  Practice breast self-awareness. This means understanding how your breasts normally appear and feel.  It also means doing regular breast self-exams. Let your health care provider know about any changes, no matter how small.  If you are in your 20s or 30s, you should have a clinical breast exam (CBE) by a health care provider every 1-3 years as part of a regular health exam.  If you are 40 or older, have a CBE every year. Also consider having a breast X-ray (mammogram) every year.  If you have a family history of breast cancer, talk to your health care provider about genetic screening.  If you   are at high risk for breast cancer, talk to your health care provider about having an MRI and a mammogram every year.  Breast cancer gene (BRCA) assessment is recommended for women who have family members with BRCA-related cancers. BRCA-related cancers include:  Breast.  Ovarian.  Tubal.  Peritoneal cancers.  Results of the assessment will determine the need for genetic counseling and BRCA1 and BRCA2 testing. Cervical Cancer Your health care provider may recommend that you be screened regularly for cancer of the pelvic organs (ovaries, uterus, and  vagina). This screening involves a pelvic examination, including checking for microscopic changes to the surface of your cervix (Pap test). You may be encouraged to have this screening done every 3 years, beginning at age 21.  For women ages 30-65, health care providers may recommend pelvic exams and Pap testing every 3 years, or they may recommend the Pap and pelvic exam, combined with testing for human papilloma virus (HPV), every 5 years. Some types of HPV increase your risk of cervical cancer. Testing for HPV may also be done on women of any age with unclear Pap test results.  Other health care providers may not recommend any screening for nonpregnant women who are considered low risk for pelvic cancer and who do not have symptoms. Ask your health care provider if a screening pelvic exam is right for you.  If you have had past treatment for cervical cancer or a condition that could lead to cancer, you need Pap tests and screening for cancer for at least 20 years after your treatment. If Pap tests have been discontinued, your risk factors (such as having a new sexual partner) need to be reassessed to determine if screening should resume. Some women have medical problems that increase the chance of getting cervical cancer. In these cases, your health care provider may recommend more frequent screening and Pap tests. Colorectal Cancer  This type of cancer can be detected and often prevented.  Routine colorectal cancer screening usually begins at 22 years of age and continues through 22 years of age.  Your health care provider may recommend screening at an earlier age if you have risk factors for colon cancer.  Your health care provider may also recommend using home test kits to check for hidden blood in the stool.  A small camera at the end of a tube can be used to examine your colon directly (sigmoidoscopy or colonoscopy). This is done to check for the earliest forms of colorectal  cancer.  Routine screening usually begins at age 50.  Direct examination of the colon should be repeated every 5-10 years through 22 years of age. However, you may need to be screened more often if early forms of precancerous polyps or small growths are found. Skin Cancer  Check your skin from head to toe regularly.  Tell your health care provider about any new moles or changes in moles, especially if there is a change in a mole's shape or color.  Also tell your health care provider if you have a mole that is larger than the size of a pencil eraser.  Always use sunscreen. Apply sunscreen liberally and repeatedly throughout the day.  Protect yourself by wearing long sleeves, pants, a wide-brimmed hat, and sunglasses whenever you are outside. HEART DISEASE, DIABETES, AND HIGH BLOOD PRESSURE   High blood pressure causes heart disease and increases the risk of stroke. High blood pressure is more likely to develop in:  People who have blood pressure in the high end   of the normal range (130-139/85-89 mm Hg).  People who are overweight or obese.  People who are African American.  If you are 38-23 years of age, have your blood pressure checked every 3-5 years. If you are 61 years of age or older, have your blood pressure checked every year. You should have your blood pressure measured twice--once when you are at a hospital or clinic, and once when you are not at a hospital or clinic. Record the average of the two measurements. To check your blood pressure when you are not at a hospital or clinic, you can use:  An automated blood pressure machine at a pharmacy.  A home blood pressure monitor.  If you are between 45 years and 39 years old, ask your health care provider if you should take aspirin to prevent strokes.  Have regular diabetes screenings. This involves taking a blood sample to check your fasting blood sugar level.  If you are at a normal weight and have a low risk for diabetes,  have this test once every three years after 22 years of age.  If you are overweight and have a high risk for diabetes, consider being tested at a younger age or more often. PREVENTING INFECTION  Hepatitis B  If you have a higher risk for hepatitis B, you should be screened for this virus. You are considered at high risk for hepatitis B if:  You were born in a country where hepatitis B is common. Ask your health care provider which countries are considered high risk.  Your parents were born in a high-risk country, and you have not been immunized against hepatitis B (hepatitis B vaccine).  You have HIV or AIDS.  You use needles to inject street drugs.  You live with someone who has hepatitis B.  You have had sex with someone who has hepatitis B.  You get hemodialysis treatment.  You take certain medicines for conditions, including cancer, organ transplantation, and autoimmune conditions. Hepatitis C  Blood testing is recommended for:  Everyone born from [birth year] through 1967.  Anyone with known risk factors for hepatitis C. Sexually transmitted infections (STIs)  You should be screened for sexually transmitted infections (STIs) including gonorrhea and chlamydia if:  You are sexually active and are younger than 22 years of age.  You are older than 22 years of age and your health care provider tells you that you are at risk for this type of infection.  Your sexual activity has changed since you were last screened and you are at an increased risk for chlamydia or gonorrhea. Ask your health care provider if you are at risk.  If you do not have HIV, but are at risk, it may be recommended that you take a prescription medicine daily to prevent HIV infection. This is called pre-exposure prophylaxis (PrEP). You are considered at risk if:  You are sexually active and do not regularly use condoms or know the HIV status of your partner(s).  You take drugs by injection.  You are sexually  active with a partner who has HIV. Talk with your health care provider about whether you are at high risk of being infected with HIV. If you choose to begin PrEP, you should first be tested for HIV. You should then be tested every 3 months for as long as you are taking PrEP.  PREGNANCY   If you are premenopausal and you may become pregnant, ask your health care provider about preconception counseling.  If you may  become pregnant, take 400 to 800 micrograms (mcg) of folic acid every day.  If you want to prevent pregnancy, talk to your health care provider about birth control (contraception). OSTEOPOROSIS AND MENOPAUSE   Osteoporosis is a disease in which the bones lose minerals and strength with aging. This can result in serious bone fractures. Your risk for osteoporosis can be identified using a bone density scan.  If you are 65 years of age or older, or if you are at risk for osteoporosis and fractures, ask your health care provider if you should be screened.  Ask your health care provider whether you should take a calcium or vitamin D supplement to lower your risk for osteoporosis.  Menopause may have certain physical symptoms and risks.  Hormone replacement therapy may reduce some of these symptoms and risks. Talk to your health care provider about whether hormone replacement therapy is right for you.  HOME CARE INSTRUCTIONS   Schedule regular health, dental, and eye exams.  Stay current with your immunizations.   Do not use any tobacco products including cigarettes, chewing tobacco, or electronic cigarettes.  If you are pregnant, do not drink alcohol.  If you are breastfeeding, limit how much and how often you drink alcohol.  Limit alcohol intake to no more than 1 drink per day for nonpregnant women. One drink equals 12 ounces of beer, 5 ounces of wine, or 1 ounces of hard liquor.  Do not use street drugs.  Do not share needles.  Ask your health care provider for help if  you need support or information about quitting drugs.  Tell your health care provider if you often feel depressed.  Tell your health care provider if you have ever been abused or do not feel safe at home.   This information is not intended to replace advice given to you by your health care provider. Make sure you discuss any questions you have with your health care provider.   Document Released: 02/16/2011 Document Revised: 08/24/2014 Document Reviewed: 07/05/2013 Elsevier Interactive Patient Education 2016 Elsevier Inc. Bacterial Vaginosis Bacterial vaginosis is a vaginal infection that occurs when the normal balance of bacteria in the vagina is disrupted. It results from an overgrowth of certain bacteria. This is the most common vaginal infection in women of childbearing age. Treatment is important to prevent complications, especially in pregnant women, as it can cause a premature delivery. CAUSES  Bacterial vaginosis is caused by an increase in harmful bacteria that are normally present in smaller amounts in the vagina. Several different kinds of bacteria can cause bacterial vaginosis. However, the reason that the condition develops is not fully understood. RISK FACTORS Certain activities or behaviors can put you at an increased risk of developing bacterial vaginosis, including:  Having a new sex partner or multiple sex partners.  Douching.  Using an intrauterine device (IUD) for contraception. Women do not get bacterial vaginosis from toilet seats, bedding, swimming pools, or contact with objects around them. SIGNS AND SYMPTOMS  Some women with bacterial vaginosis have no signs or symptoms. Common symptoms include:  Grey vaginal discharge.  A fishlike odor with discharge, especially after sexual intercourse.  Itching or burning of the vagina and vulva.  Burning or pain with urination. DIAGNOSIS  Your health care provider will take a medical history and examine the vagina for  signs of bacterial vaginosis. A sample of vaginal fluid may be taken. Your health care provider will look at this sample under a microscope to check for bacteria   and abnormal cells. A vaginal pH test may also be done.  TREATMENT  Bacterial vaginosis may be treated with antibiotic medicines. These may be given in the form of a pill or a vaginal cream. A second round of antibiotics may be prescribed if the condition comes back after treatment. Because bacterial vaginosis increases your risk for sexually transmitted diseases, getting treated can help reduce your risk for chlamydia, gonorrhea, HIV, and herpes. HOME CARE INSTRUCTIONS   Only take over-the-counter or prescription medicines as directed by your health care provider.  If antibiotic medicine was prescribed, take it as directed. Make sure you finish it even if you start to feel better.  Tell all sexual partners that you have a vaginal infection. They should see their health care provider and be treated if they have problems, such as a mild rash or itching.  During treatment, it is important that you follow these instructions:  Avoid sexual activity or use condoms correctly.  Do not douche.  Avoid alcohol as directed by your health care provider.  Avoid breastfeeding as directed by your health care provider. SEEK MEDICAL CARE IF:   Your symptoms are not improving after 3 days of treatment.  You have increased discharge or pain.  You have a fever. MAKE SURE YOU:   Understand these instructions.  Will watch your condition.  Will get help right away if you are not doing well or get worse. FOR MORE INFORMATION  Centers for Disease Control and Prevention, Division of STD Prevention: [employer]raud[employer] American Sexual Health Association (ASHA): www.ashastd.org    This information is not intended to replace advice given to you by your health care provider. Make sure you discuss any questions you have with your health care provider.    Document Released: 08/03/2005 Document Revised: 08/24/2014 Document Reviewed: 03/15/2013 Elsevier Interactive Patient Education [employer].

## 2016-02-25 NOTE — Progress Notes (Signed)
Mary Jacobs Feb 21, 1996 [id number]    History:    Presents for annual exam.  Rare bleeding on Mirena IUD placed 03/2013. [place]. Gardasil series completed. History of epilepsy Dr. Gaynell Jacobs managing seizure-free for greater than 5 years, currently weaning off medications. Reports unable to drive for one year after stopping all epilepsy medication.  Past medical history, past surgical history, family history and social history were all reviewed and documented in the EPIC chart. 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  Parents healthy.  ROS:  A ROS was performed and pertinent positives and negatives are included.  Exam:  Filed Vitals:   02/25/16 1054  BP: 122/80    General appearance:  Normal Thyroid:  Symmetrical, normal in size, without palpable masses or nodularity. Respiratory  Auscultation:  Clear without wheezing or rhonchi Cardiovascular  Auscultation:  Regular rate, without rubs, murmurs or gallops  Edema/varicosities:  Not grossly evident Abdominal  Soft,nontender, without masses, guarding or rebound.  Liver/spleen:  No organomegaly noted  Hernia:  None appreciated  Skin  Inspection:  Grossly normal   Breasts: Examined lying and sitting.     Right: Without masses, retractions, discharge or axillary adenopathy.     Left: Without masses, retractions, discharge or axillary adenopathy. Gentitourinary   Inguinal/mons:  Normal without inguinal adenopathy  External genitalia:  Normal  BUS/Urethra/NormalSkene's glands:  Normal  Vagina:  Moderate white discharge, wet prep positive for clues, TNTC bacteria  Cervix:  Normal IUD strings visible  Uterus:   normal in size, shape and contour.  Midline and mobile  Adnexa/parametria:     Rt: Without masses or tenderness.   Lt: Without masses or tenderness.  Anus and perineum: Normal    Assessment/Plan:  20 y.o. [place] [employer] for annual exam with no complaints.  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 Gaynell Jacobs manages meds Bacteria vaginosis  Plan: MetroGel vaginal cream 1 applicator at bedtime 5, alcohol precautions reviewed. Instructed to call if no relief of symptoms. Diflucan 150 mg 1 dose if vaginal itching occurs. SBE's, continue active lifestyle, exercise, calcium rich diet, MVI daily encouraged. Aware Mirena IUD is good for 5 years. Campus safety reviewed. CBC, TSH,UA, Pap. New screening guidelines reviewed.  Mary Jacobs [facility], 11:39 AM 02/25/2016

## 2016-02-26 LAB — URINALYSIS W MICROSCOPIC + REFLEX CULTURE
BILIRUBIN URINE: NEGATIVE
CRYSTALS: NONE SEEN [HPF]
Casts: NONE SEEN [LPF]
GLUCOSE, UA: NEGATIVE
Hgb urine dipstick: NEGATIVE
KETONES UR: NEGATIVE
Nitrite: NEGATIVE
PH: 6 (ref 5.0–8.0)
Protein, ur: NEGATIVE
SPECIFIC GRAVITY, URINE: 1.027 (ref 1.001–1.035)
Yeast: NONE SEEN [HPF]

## 2016-02-26 LAB — PAP IG W/ RFLX HPV ASCU

## 2016-02-27 LAB — URINE CULTURE
COLONY COUNT: NO GROWTH
Organism ID, Bacteria: NO GROWTH

## 2016-03-31 ENCOUNTER — Encounter: Payer: Self-pay | Admitting: Pediatrics

## 2016-04-11 ENCOUNTER — Encounter: Payer: Self-pay | Admitting: Pediatrics

## 2016-04-25 ENCOUNTER — Encounter: Payer: Self-pay | Admitting: Pediatrics

## 2016-04-25 DIAGNOSIS — G43009 Migraine without aura, not intractable, without status migrainosus: Secondary | ICD-10-CM

## 2016-04-27 MED ORDER — ELETRIPTAN HYDROBROMIDE 40 MG PO TABS: ORAL_TABLET | ORAL | 5 refills | Status: DC

## 2016-05-22 ENCOUNTER — Telehealth (INDEPENDENT_AMBULATORY_CARE_PROVIDER_SITE_OTHER): Payer: Self-pay

## 2016-05-22 NOTE — Telephone Encounter (Signed)
You did a really good job, Forensic psychologist.  It has been my experience that there is not a significant difference between levetiracetam and Keppra in terms of its efficacy.  That being said, Mary Jacobs has been on trade drug for a long time (even though she may have been on generic without my knowledge).  In this situation, I think you made a good decision.

## 2016-05-22 NOTE — Telephone Encounter (Signed)
[facility] lvm asking if they could fill pt's Keppra as a generic. They said that Micaela has taken the generic version in the past. I called pharmacy and asked why they wanted to fill with generic. Pharmacy tech said that pharmacy did not have and brand available until tomorrow and that pt was out of her medication. I asked them to look at another [facility] in the area to see if they had brand Keppra. She said that [facility] [place] [facility]. I suggested that they have the Rx sent over there for BMN. I told her that patient could call me back if there are any questions.

## 2016-05-22 NOTE — Telephone Encounter (Signed)
Mary Jacobs called me and said that a pharmacy in the past gave her generic once and that she did not realize it until recently. She said that she told her mom about the generic being given to her without her knowledge and mother was not pleased. Mary Jacobs is going to pick her medication up [facility] a [facility] that [facility] Brand Keppra. I told her to call me if there are any issues , gave her my extension #. Mary Jacobs expressed understanding.

## 2016-08-03 ENCOUNTER — Encounter (INDEPENDENT_AMBULATORY_CARE_PROVIDER_SITE_OTHER): Payer: Self-pay | Admitting: Pediatrics

## 2016-08-03 ENCOUNTER — Ambulatory Visit (INDEPENDENT_AMBULATORY_CARE_PROVIDER_SITE_OTHER): Payer: 59 | Admitting: Pediatrics

## 2016-08-03 VITALS — BP 100/80 | HR 72 | Ht 63.0 in | Wt 127.4 lb

## 2016-08-03 DIAGNOSIS — G40309 Generalized idiopathic epilepsy and epileptic syndromes, not intractable, without status epilepticus: Secondary | ICD-10-CM | POA: Diagnosis not present

## 2016-08-03 DIAGNOSIS — G43009 Migraine without aura, not intractable, without status migrainosus: Secondary | ICD-10-CM | POA: Diagnosis not present

## 2016-08-03 DIAGNOSIS — G40209 Localization-related (focal) (partial) symptomatic epilepsy and epileptic syndromes with complex partial seizures, not intractable, without status epilepticus: Secondary | ICD-10-CM

## 2016-08-03 MED ORDER — VERAPAMIL HCL 40 MG PO TABS: ORAL_TABLET | ORAL | 3 refills | Status: DC

## 2016-08-03 MED ORDER — KEPPRA 500 MG PO TABS: ORAL_TABLET | ORAL | 3 refills | Status: DC

## 2016-08-03 NOTE — Patient Instructions (Signed)
Plan to see me before you had off to [facility] events where you decide to go.  Good luck in your final semester.  I am very proud of you.

## 2016-08-03 NOTE — Progress Notes (Signed)
Patient: Mary Jacobs MRN: [id number] Sex: female DOB: Feb 10, 1996  Provider: Wyline Copas, MD Location of Care: [facility]  Note type: Routine return visit  History of Present Illness: Referral Source: [name], MD History from: patient and [facility] chart Chief Complaint: Migraines/Epilepsy  Mary Jacobs is a 20 y.o. female who returns August 03, 2016, for the first time since February 20, 2016.  Mary Jacobs has a longstanding history of complex partial seizures with secondary generalization that have been completely controlled on [place] drug.  She also has migraine without aura that has markedly diminished over the last few months on verapamil.  She wants to slowly taper these medications.  She has been doing this on her own with the [place] with my blessing.  We have not made any changes more often than every six months.  She will graduate from [employer] in May with a degree in marketing and business.  She has only one course this semester this upcoming semester she had five this last.  She enjoys sailing and probably will have a lot of time to do that.  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  She has a long distance relationship with a man in the [place] and is considering moving to [place].  If she does so, we will likely refer her to a child neurologist there.  Review of Systems: 12 system review was assessed and was negative  Past Medical History History reviewed. No pertinent past medical history. Hospitalizations: No., Head Injury: No., Nervous System Infections: No., Immunizations up to date: Yes.    Hospitalized at 20 years old due to seizure activity. Seizures were partial onset with secondary generalization. MRI of the brain was normal. EEG showed diffuse slowing as part of a post ictal state. She had onset of migraines at 21 or 20 years of age. Keppra has completely  controlled her seizures, except for times when there have been issues with compliance.  She has taken Zomig with transient benefit. Propranolol was started in 2008 as a preventative medication for migraine increased to 20 mg BId. She has tried Maxalt, Axert, and Relpax without benefit. Imitrex/sumatriptan has been more effective. Atenolol was started in August, 2009 and adjusted to 100 mg. Topiramate was started March, 2011. She reached 50 mg q HS and stopped it in January, 2012 due to cognitive blunting. Verapamil was started February, 2013.  Birth History 8 pound infant born to a gravida 3 para 2004 woman.  Pregnancy labor and delivery were unremarkable.  Patient went home with her mother.  Growth and development was normal.  Behavior History none  Surgical History Procedure Laterality Date  . Mirena     Inserted 03-20-13  . TYMPANOSTOMY TUBE PLACEMENT     Family History family history includes Asthma in her maternal grandmother; Breast cancer in her maternal aunt; Cancer in her brother and mother; Migraines in her brother, maternal uncle, and mother; Rectal cancer in her paternal grandfather; Stroke in her maternal grandfather; Thyroid disease in her sister. Family history is negative for migraines, seizures, intellectual disabilities, blindness, deafness, birth defects, chromosomal disorder, or autism.  Social History . Marital status: Single    Spouse name: N/A  . Number of children: N/A  . Years of education: N/A   Social History Main Topics  . Smoking status: Never Smoker  . Smokeless tobacco: Never Used  . Alcohol use No  .  Drug use: No  . 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     She will graduate in May.    She lives with roommates.     She enjoys exercising and  reading.   No Known Allergies  Physical Exam BP 100/80   Pulse 72   Ht 5\' 3"  (1.6 m)   Wt 127 lb 6.4 oz (57.8 kg)   BMI 22.57 kg/m   General: alert, well developed, well nourished, in no acute distress, blond hair, blue eyes, right handed Head: normocephalic, no dysmorphic features Ears, Nose and Throat: Otoscopic: tympanic membranes normal; pharynx: oropharynx is pink without exudates or tonsillar hypertrophy Neck: supple, full range of motion, no cranial or cervical bruits Respiratory: auscultation clear Cardiovascular: no murmurs, pulses are normal Musculoskeletal: no skeletal deformities or apparent scoliosis Skin: no rashes or neurocutaneous lesions  Neurologic Exam  Mental Status: alert; oriented to person, place and year; knowledge is normal for age; language is normal Cranial Nerves: visual fields are full to double simultaneous stimuli; extraocular movements are full and conjugate; pupils are round reactive to light; funduscopic examination shows sharp disc margins with normal vessels; symmetric facial strength; midline tongue and uvula; air conduction is greater than bone conduction bilaterally Motor: Normal strength, tone and mass; good fine motor movements; no pronator drift Sensory: intact responses to cold, vibration, proprioception and stereognosis Coordination: good finger-to-nose, rapid repetitive alternating movements and finger apposition Gait and Station: normal gait and station: patient is able to walk on heels, toes and tandem without difficulty; balance is adequate; Romberg exam is negative; Gower response is negative Reflexes: symmetric and diminished bilaterally; no clonus; bilateral flexor plantar responses  Assessment 1. Generalized convulsive epilepsy, G40.309. 2. Partial epilepsy with impairment of consciousness, G40.209. 3. Migraine without aura, without status migrainosus, not intractable, G43.009.  Discussion Mary Jacobs is doing extremely well with  headaches and seizures.    Plan Currently, she takes Keppra 500 mg tablets two twice daily and verapamil 40 mg two twice daily.  I recommended dropping to one in the morning and two at nighttime.  She has not needed to use Relpax.  I spent 15 minutes of face-to-face time with her.  She will return to see me in five months' time when she graduates from college.  After that, we will decide how best to approach the future care for her.  Medication List   Accurate as of 08/03/16 11:55 AM.      eletriptan 40 MG tablet Commonly known as:  RELPAX Take at onset of migraine with 400 mg of ibuprofen, may repeat in 2 hours if headache persists or recurs.   fluconazole 150 MG tablet Commonly known as:  DIFLUCAN Take 1 tablet (150 mg total) by mouth once.   ibuprofen 800 MG tablet Commonly known as:  ADVIL,MOTRIN Take 1 tablet (800 mg total) by mouth every 8 (eight) hours as needed for pain.   KEPPRA 500 MG tablet Generic drug:  levETIRAcetam Take 2 tablets twice daily   metroNIDAZOLE 0.75 % vaginal gel Commonly known as:  METROGEL VAGINAL 1 applicator per vagina at HS x 5   verapamil 40 MG tablet Commonly known as:  CALAN Take 2 tablets twice daily     The medication list was reviewed and reconciled.  All changes or newly prescribed medications were explained.  A complete medication list was provided to the patient/caregiver.  Jodi Geralds MD

## 2016-12-09 ENCOUNTER — Other Ambulatory Visit (INDEPENDENT_AMBULATORY_CARE_PROVIDER_SITE_OTHER): Payer: Self-pay | Admitting: Pediatrics

## 2016-12-09 DIAGNOSIS — G40309 Generalized idiopathic epilepsy and epileptic syndromes, not intractable, without status epilepticus: Secondary | ICD-10-CM

## 2016-12-09 DIAGNOSIS — G40209 Localization-related (focal) (partial) symptomatic epilepsy and epileptic syndromes with complex partial seizures, not intractable, without status epilepticus: Secondary | ICD-10-CM

## 2016-12-09 NOTE — Telephone Encounter (Signed)
Rx refill

## 2016-12-10 ENCOUNTER — Encounter (INDEPENDENT_AMBULATORY_CARE_PROVIDER_SITE_OTHER): Payer: Self-pay | Admitting: Pediatrics

## 2016-12-10 NOTE — Telephone Encounter (Signed)
-----   Message from Rockwell Germany, NP sent at 12/09/2016 11:51 AM EDT ----- Regarding: Needs appointment [facility] needs an appointment with Dr [name]. After his vacation is ok.  Thanks,  Otila Kluver

## 2016-12-30 ENCOUNTER — Encounter: Payer: Self-pay | Admitting: Gynecology

## 2017-01-18 ENCOUNTER — Ambulatory Visit (INDEPENDENT_AMBULATORY_CARE_PROVIDER_SITE_OTHER): Payer: 59 | Admitting: Family

## 2017-01-18 ENCOUNTER — Encounter (INDEPENDENT_AMBULATORY_CARE_PROVIDER_SITE_OTHER): Payer: Self-pay | Admitting: Family

## 2017-01-18 DIAGNOSIS — G40209 Localization-related (focal) (partial) symptomatic epilepsy and epileptic syndromes with complex partial seizures, not intractable, without status epilepticus: Secondary | ICD-10-CM

## 2017-01-18 DIAGNOSIS — G43009 Migraine without aura, not intractable, without status migrainosus: Secondary | ICD-10-CM

## 2017-01-18 DIAGNOSIS — G40309 Generalized idiopathic epilepsy and epileptic syndromes, not intractable, without status epilepticus: Secondary | ICD-10-CM | POA: Diagnosis not present

## 2017-01-18 NOTE — Progress Notes (Addendum)
Patient: Mary Jacobs MRN: [id number] Sex: female DOB: 03-17-1996  Provider: Rockwell Germany, NP Location of Care: [facility]  Note type: Routine return visit  History of Present Illness: Referral Source: [place] History from: patient Chief Complaint: Follow up on Migraines   Mary Jacobs is a 21 y.o. young woman with history of complex partial seizures with secondary generalization. She was last seen on August 03, 2016 by Dr [name]. Mary Jacobs's seizures have been completely controlled on brand Keppra. She also has migraine without aura that has markedly diminished over time on Verapamil.   Mary Jacobs has been slowly tapering her medications after discussing this plan with Dr [name]. She has not made changes more often than every 6 months, and has done well without any increase in migraines or breakthrough seizures. Mary Jacobs is currently taking 1 Keppra and Verapamil in the morning and 2 of each in the evening. She wants to being taking 1 of each now for the next 6 months.   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 She is in the process of looking for a position in finance with a [employer] before she returns for a Master's degree. She is hoping to find a position in [place] or [place] by this fall  Mary Jacobs has been generally healthy since she was last seen. She has no other health concerns today other than previously mentioned.  Review of Systems: Please see the HPI for neurologic and other pertinent review of systems. Otherwise, the following systems are noncontributory including constitutional, eyes, ears, nose and throat, cardiovascular, respiratory, gastrointestinal, genitourinary, musculoskeletal, skin, endocrine, hematologic/lymph, allergic/immunologic and psychiatric.   History reviewed. No pertinent past medical history. Hospitalizations: No.,  Head Injury:Concussion without LOC, Nervous System Infections: No., Immunizations up to date: Yes.   Past Medical History Comments: Hospitalized at 21 years old due to seizure activity. Seizures were partial onset with secondary generalization. MRI of the brain was normal. EEG showed diffuse slowing as part of a post ictal state. She had onset of migraines at 35 or 21 years of age. Keppra has completely controlled her seizures, except for times when there have been issues with compliance.  She has taken Zomig with transient benefit. Propranolol was started in 2008 as a preventative medication for migraine increased to 20 mg BId. She has tried Maxalt, Axert, and Relpax without benefit. Imitrex/sumatriptan has been more effective. Atenolol was started in August, 2009 and adjusted to 100 mg. Topiramate was started March, 2011. She reached 50 mg q HS and stopped it in January, 2012 due to cognitive blunting. Verapamil was started February, 2013.   Surgical History Past Surgical History:  Procedure Laterality Date  . Mirena     Inserted 03-20-13  . TYMPANOSTOMY TUBE PLACEMENT      Family History family history includes Asthma in her maternal grandmother; Breast cancer in her maternal aunt; Cancer in her brother and mother; Migraines in her brother, maternal uncle, and mother; Rectal cancer in her paternal grandfather; Stroke in her maternal grandfather; Thyroid disease in her sister. Family History is otherwise negative for migraines, seizures, cognitive impairment, blindness, deafness, birth defects, chromosomal disorder, autism.  Social History Social History   Social History  . Marital status: Single    Spouse name: N/A  . Number of children: N/A  . Years of education: N/A   Social History Main Topics  .  Smoking status: Never Smoker  . Smokeless tobacco: Never Used  . Alcohol use No  . Drug use: No  . Sexual activity: Not Currently    Birth control/ protection: IUD     Comment: Mirena  inserted 03-20-13   Other Topics Concern  . 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[facility], Government social research officer, Live with roommates     Allergies No Known Allergies  Physical Exam BP 98/70   Pulse 70   Ht 5' 3.5" (1.613 m)   Wt 128 lb 3.2 oz (58.2 kg)   LMP 12/29/2016 (LMP Unknown) Comment: spotting  BMI 22.35 kg/m  General: alert, well developed, well nourished, in no acute distress, blond hair, blue eyes, right handed Head: normocephalic, no dysmorphic features Ears, Nose and Throat: Otoscopic: tympanic membranes normal; pharynx: oropharynx is pink without exudates or tonsillar hypertrophy Neck: supple, full range of motion, no cranial or cervical bruits Respiratory: auscultation clear Cardiovascular: no murmurs, pulses are normal Musculoskeletal: no skeletal deformities or apparent scoliosis Skin: no rashes or neurocutaneous lesions  Neurologic Exam  Mental Status: alert; oriented to person, place and year; knowledge is normal for age; language is normal Cranial Nerves: visual fields are full to double simultaneous stimuli; extraocular movements are full and conjugate; pupils are round reactive to light; funduscopic examination shows sharp disc margins with normal vessels; symmetric facial strength; midline tongue and uvula; hearing is equal and symmetric Motor: Normal strength, tone and mass; good fine motor movements; no pronator drift Sensory: intact responses to touch and temperature Coordination: good finger-to-nose, rapid repetitive alternating movements and finger apposition Gait and Station: normal gait and station: patient is able to walk on heels, toes and tandem without difficulty; balance is adequate; Romberg exam is negative; Gower response is negative Reflexes: symmetric and diminished bilaterally; no clonus; bilateral flexor plantar responses  Impression 1. Generalized convulsive epilepsy, G40.309. 2. Partial epilepsy with impairment of  consciousness, G40.209. 3. Migraine without aura, without status migrainosus, not intractable, G43.009.  Recommendations for plan of care The patient's previous [facility] records were reviewed. Mary Jacobs has neither had nor required imaging or lab studies since the last visit. She is a 21 year old young woman with history of complex partial seizures with secondary generalization and migraine without aura. She is taking and tolerating brand Keppra for her seizures and Verapamil for migraine prevention. Mary Jacobs has been tapering her medication and doing well. She will reduce the dose of each to 1 tablet twice per day for the next 6 months. She knows to contact this office if she has any breakthrough seizures or increase in migraines. Mary Jacobs has a summer job in [place] but is still looking for a full time position in her chosen career. I will see her again in December 2018, but also talked with her about transitioning to adult neurology care once she moves to a new city. Mary Jacobs will stay in touch by phone or MyChart in the interim.   The medication list was reviewed and reconciled.  I reviewed changes were made in the prescribed medications today.  A complete medication list was provided to the patient/caregiver.  Allergies as of 01/18/2017   No Known Allergies     Medication List       Accurate as of 01/18/17 11:59 PM. Always use your most recent med list.          eletriptan 40 MG tablet Commonly known as:  RELPAX Take at onset of migraine with 400 mg of  ibuprofen, may repeat in 2 hours if headache persists or recurs.   ibuprofen 800 MG tablet Commonly known as:  ADVIL,MOTRIN Take 1 tablet (800 mg total) by mouth every 8 (eight) hours as needed for pain.   KEPPRA 500 MG tablet Generic drug:  levETIRAcetam Take 1 tablet in the morning and 1 tablet at night   verapamil 40 MG tablet Commonly known as:  CALAN Take 1 tablet in the morning and 1 tablet at nighttime       Dr. [name] was consulted  regarding the patient.   Total time spent with the patient was 25 minutes, of which 50% or more was spent in counseling and coordination of care.   Rockwell Germany NP-C

## 2017-01-19 ENCOUNTER — Encounter: Payer: Self-pay | Admitting: Women's Health

## 2017-01-19 ENCOUNTER — Ambulatory Visit (INDEPENDENT_AMBULATORY_CARE_PROVIDER_SITE_OTHER): Payer: 59 | Admitting: Women's Health

## 2017-01-19 VITALS — BP 120/78 | Ht 63.0 in | Wt 132.0 lb

## 2017-01-19 DIAGNOSIS — Z01419 Encounter for gynecological examination (general) (routine) without abnormal findings: Secondary | ICD-10-CM

## 2017-01-19 DIAGNOSIS — B373 Candidiasis of vulva and vagina: Secondary | ICD-10-CM

## 2017-01-19 DIAGNOSIS — Z30432 Encounter for removal of intrauterine contraceptive device: Secondary | ICD-10-CM

## 2017-01-19 DIAGNOSIS — B3731 Acute candidiasis of vulva and vagina: Secondary | ICD-10-CM

## 2017-01-19 HISTORY — PX: IUD REMOVAL: SHX5392

## 2017-01-19 LAB — CBC WITH DIFFERENTIAL/PLATELET
Basophils Absolute: 49 cells/uL (ref 0–200)
Basophils Relative: 1 %
EOS PCT: 4 %
Eosinophils Absolute: 196 cells/uL (ref 15–500)
HCT: 41.6 % (ref 35.0–45.0)
Hemoglobin: 13.4 g/dL (ref 11.7–15.5)
LYMPHS ABS: 1519 {cells}/uL (ref 850–3900)
Lymphocytes Relative: 31 %
MCH: 29.7 pg (ref 27.0–33.0)
MCHC: 32.2 g/dL (ref 32.0–36.0)
MCV: 92.2 fL (ref 80.0–100.0)
MONOS PCT: 10 %
MPV: 10.6 fL (ref 7.5–12.5)
Monocytes Absolute: 490 cells/uL (ref 200–950)
NEUTROS PCT: 54 %
Neutro Abs: 2646 cells/uL (ref 1500–7800)
PLATELETS: 275 10*3/uL (ref 140–400)
RBC: 4.51 MIL/uL (ref 3.80–5.10)
RDW: 13.3 % (ref 11.0–15.0)
WBC: 4.9 10*3/uL (ref 3.8–10.8)

## 2017-01-19 MED ORDER — FLUCONAZOLE 150 MG PO TABS: 150.0000 mg | ORAL_TABLET | Freq: Once | ORAL | 1 refills | Status: AC

## 2017-01-19 MED ORDER — FLUCONAZOLE 150 MG PO TABS: 150.0000 mg | ORAL_TABLET | Freq: Once | ORAL | 1 refills | Status: DC

## 2017-01-19 NOTE — Patient Instructions (Signed)

## 2017-01-19 NOTE — Progress Notes (Signed)
Mary Jacobs 06/27/1996 [id number]    History:    Presents for annual exam.  2014 Mirena IUD amenorrhea requesting to have removed. [place]. Gardasil series completed. History of migraines without aura, rare. History of epilepsy seizure free but continues on medication. Normal Pap history.  Past medical history, past surgical history, family history and social history were all reviewed and documented in the EPIC chart. 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 Parents healthy.  ROS:  A ROS was performed and pertinent positives and negatives are included.  Exam:  Vitals:   01/19/17 0916  BP: 120/78  Weight: 132 lb (59.9 kg)  Height: 5\' 3"  (1.6 m)   Body mass index is 23.38 kg/m.   General appearance:  Normal Thyroid:  Symmetrical, normal in size, without palpable masses or nodularity. Respiratory  Auscultation:  Clear without wheezing or rhonchi Cardiovascular  Auscultation:  Regular rate, without rubs, murmurs or gallops  Edema/varicosities:  Not grossly evident Abdominal  Soft,nontender, without masses, guarding or rebound.  Liver/spleen:  No organomegaly noted  Hernia:  None appreciated  Skin  Inspection:  Grossly normal   Breasts: Examined lying and sitting.     Right: Without masses, retractions, discharge or axillary adenopathy.     Left: Without masses, retractions, discharge or axillary adenopathy. Gentitourinary   Inguinal/mons:  Normal without inguinal adenopathy  External genitalia:  Normal  BUS/Urethra/Skene's glands:  Normal  Vagina:  Normal  Cervix:  Normal IUD strings visible,  Uterus:  normal in size, shape and contour.  Midline and mobile  Adnexa/parametria:     Rt: Without masses or tenderness.   Lt: Without masses or tenderness.  Anus and perineum: Normal    Assessment/Plan:  21 y.o. [place]  for annual exam.    03/2013 Mirena IUD-amenorrhea  removed Epilepsy-Dr. Faylene Million managing/seizure free Migraines rare  Plan: Options reviewed, wishes to proceed with IUD removal, IUD strings grasped with ring forcep removed intact shown to patient and discarded. Declines other contraception, will follow-up in [place] if problems with cycle to have another IUD placed. SBE's, continue healthy lifestyle of regular exercise and healthy diet. CBC, Pap normal 2017, new screening guidelines reviewed.   [place]in[place], 10:56 AM 01/19/2017

## 2017-01-19 NOTE — Patient Instructions (Signed)
Reduce the Keppra dose to 1 tablet twice per day as we discussed. Let me know if you have any seizures.   Reduce the Verapamil dose to 1 tablet twice per day. Let me know if your migraines become more frequent.   Please stay in touch by MyChart for any questions or concerns.   Please plan to return for follow up in December or sooner if needed.

## 2017-05-12 ENCOUNTER — Ambulatory Visit (INDEPENDENT_AMBULATORY_CARE_PROVIDER_SITE_OTHER): Payer: 59 | Admitting: Gynecology

## 2017-05-12 ENCOUNTER — Encounter: Payer: Self-pay | Admitting: Gynecology

## 2017-05-12 VITALS — BP 114/66

## 2017-05-12 DIAGNOSIS — L680 Hirsutism: Secondary | ICD-10-CM

## 2017-05-12 DIAGNOSIS — Z23 Encounter for immunization: Secondary | ICD-10-CM | POA: Diagnosis not present

## 2017-05-12 NOTE — Addendum Note (Signed)
Addended by: Anastasio Auerbach on: 05/12/2017 12:45 PM   Modules accepted: Orders

## 2017-05-12 NOTE — Progress Notes (Signed)
    Mary Jacobs [birth year] [id number]        21 y.o.  G0P0000  presents to discuss excess hair growth.  She notes excessive facial hair and hair around her nipples. Onset around puberty. No weight changes, balding, voice changes, acne. [place] ethnicity. Had used IUD in the past but discontinued. Not sexually active.  Past medical history,surgical history, problem list, medications, allergies, family history and social history were all reviewed and documented in the EPIC chart.  Directed ROS with pertinent positives and negatives documented in the history of present illness/assessment and plan.  Exam: Caryn Bee assistant Vitals:   05/12/17 0930  BP: 114/66   General appearance:  Normal HEENT shows fine villous facial hair and sideburn distribution. No terminal hair growth. Chin negative although patient recently waxed. Chest with scant hair around nipples. No midline chest hair. Abdomen without masses guarding rebound. No abnormal lower abdominal hair growth  Assessment/Plan:  21 y.o. G0P0000 complaining of excess hair growth. Exam is normal without evidence of hirsutism/masculinization. She does have a fair amount of fine facial hair which I think is constitutional. We'll go ahead and run baseline androgen studies to include testosterone, 17 hydroxyprogesterone and DHEAS. Options for management reviewed to include low-dose oral contraceptives to suppress ovarian androgen production. Patient does have a history of migraines without aura. Possible increased risk of thrombosis/stroke associated with oral contraceptives and migraines discussed. At this point the patient is not interested in intervention. She is happy with reassurance that everything is normal. Will follow up if wants to discuss other options.    Anastasio Auerbach MD, 9:50 AM 05/12/2017

## 2017-05-12 NOTE — Patient Instructions (Signed)
follow up for blood test results

## 2017-05-15 LAB — 17-HYDROXYPROGESTERONE: 17-OH-PROGESTERONE, LC/MS/MS: 47 ng/dL

## 2017-05-15 LAB — DHEA-SULFATE: DHEA SO4: 269 ug/dL (ref 18–391)

## 2017-05-16 LAB — TESTOS,TOTAL,FREE AND SHBG (FEMALE)
FREE TESTOSTERONE: 6 pg/mL (ref 0.1–6.4)
SEX HORMONE BINDING: 47 nmol/L (ref 17–124)
Testosterone, Total, LC-MS-MS: 40 ng/dL (ref 2–45)

## 2017-05-24 ENCOUNTER — Telehealth (INDEPENDENT_AMBULATORY_CARE_PROVIDER_SITE_OTHER): Payer: Self-pay

## 2017-05-24 DIAGNOSIS — G43009 Migraine without aura, not intractable, without status migrainosus: Secondary | ICD-10-CM

## 2017-05-24 MED ORDER — ELETRIPTAN HYDROBROMIDE 40 MG PO TABS: ORAL_TABLET | ORAL | 5 refills | Status: DC

## 2017-05-24 NOTE — Telephone Encounter (Signed)
Rx has been sent electronically to the pharmacy 

## 2017-05-26 ENCOUNTER — Telehealth (INDEPENDENT_AMBULATORY_CARE_PROVIDER_SITE_OTHER): Payer: Self-pay | Admitting: Pediatrics

## 2017-05-26 DIAGNOSIS — G43009 Migraine without aura, not intractable, without status migrainosus: Secondary | ICD-10-CM

## 2017-05-26 MED ORDER — ELETRIPTAN HYDROBROMIDE 40 MG PO TABS: ORAL_TABLET | ORAL | 5 refills | Status: DC

## 2017-05-26 NOTE — Telephone Encounter (Signed)
Rx has been electronically sent to the pharmacy 

## 2017-05-26 NOTE — Telephone Encounter (Signed)
°  Who's calling (name and relationship to patient) : Mary Jacobs (Patient) Best contact number: [phone] Provider they see: [name], MD Reason for call: Calling in regards to needing a prescription refill on the generic of relpax.      PRESCRIPTION REFILL ONLY  Name of prescription: Generic of relpax Pharmacy: [facility] on [address]

## 2017-07-28 ENCOUNTER — Telehealth (INDEPENDENT_AMBULATORY_CARE_PROVIDER_SITE_OTHER): Payer: Self-pay | Admitting: Family

## 2017-07-28 NOTE — Telephone Encounter (Signed)
Received [facility] fax on 07/28/17 @ 812am, returned call to pt regarding scheduling an appt, left vmail for pt requesting a call back to sched appt from recall.

## 2017-07-29 ENCOUNTER — Telehealth (INDEPENDENT_AMBULATORY_CARE_PROVIDER_SITE_OTHER): Payer: Self-pay | Admitting: Pediatrics

## 2017-07-29 NOTE — Telephone Encounter (Signed)
°  Who's calling (name and relationship to patient) : self Best contact number: [phone] Provider they see: [name] Reason for call: Left a voicemail requesting to schedule an appointment. I returned her call and left her a voicemail advising to call our office and schedule.     PRESCRIPTION REFILL ONLY  Name of prescription:  Pharmacy:

## 2017-07-30 ENCOUNTER — Telehealth (INDEPENDENT_AMBULATORY_CARE_PROVIDER_SITE_OTHER): Payer: Self-pay | Admitting: Pediatrics

## 2017-07-30 NOTE — Telephone Encounter (Signed)
°  Who's calling (name and relationship to patient) : Sharyn Lull (mom) Best contact number: [phone] Provider they see: Dr. [name]  Reason for call: Mom wants to know if pt can be worked in before Christmas or before the [employer]. Pt is a Electronics engineer. Contact number given is the contact number for pt.

## 2017-07-30 NOTE — Telephone Encounter (Signed)
L/M informing mom that we did receive her message and to let her know that Dr. Melanee Left schedule is booked until after the New Year. Invited her to call back to let us know how she would like to proceed.

## 2017-08-03 ENCOUNTER — Ambulatory Visit (INDEPENDENT_AMBULATORY_CARE_PROVIDER_SITE_OTHER): Payer: 59 | Admitting: Family

## 2017-08-03 ENCOUNTER — Encounter (INDEPENDENT_AMBULATORY_CARE_PROVIDER_SITE_OTHER): Payer: Self-pay | Admitting: Family

## 2017-08-03 ENCOUNTER — Other Ambulatory Visit: Payer: Self-pay

## 2017-08-03 DIAGNOSIS — G40309 Generalized idiopathic epilepsy and epileptic syndromes, not intractable, without status epilepticus: Secondary | ICD-10-CM | POA: Diagnosis not present

## 2017-08-03 DIAGNOSIS — G43009 Migraine without aura, not intractable, without status migrainosus: Secondary | ICD-10-CM

## 2017-08-03 DIAGNOSIS — G40209 Localization-related (focal) (partial) symptomatic epilepsy and epileptic syndromes with complex partial seizures, not intractable, without status epilepticus: Secondary | ICD-10-CM | POA: Diagnosis not present

## 2017-08-03 MED ORDER — ELETRIPTAN HYDROBROMIDE 40 MG PO TABS: ORAL_TABLET | ORAL | 5 refills | Status: AC

## 2017-08-03 MED ORDER — VERAPAMIL HCL 40 MG PO TABS: ORAL_TABLET | ORAL | 3 refills | Status: AC

## 2017-08-03 MED ORDER — KEPPRA 500 MG PO TABS: ORAL_TABLET | ORAL | 5 refills | Status: DC

## 2017-08-03 NOTE — Progress Notes (Signed)
Patient: Mary Jacobs MRN: [id number] Sex: female DOB: 29-Jun-1996  Provider: Rockwell Germany, NP Location of Care: [facility]  Note type: Routine return visit  History of Present Illness: Referral Source: [name], MD History from: patient and [facility] chart Chief Complaint: Migraines/Epilepsy  Mary Jacobs is a 21 y.o. young woman with history of complex partial seizures with secondary generalization. She was last seen January 18, 2017. Mary Jacobs's seizures have been completely controlled on brand Keppra. She also has migraine without aura that has markedly diminished over time on Verapamil. Mary Jacobs has been slowly tapering her medication after discussing this plan with Dr [name]. She had remained seizure free and had few migraines. She is satisfied with how things are going at this time and does not plan to wean the dose again for at least 6 months.   Mary Jacobs is moving to [place], [place] in 2 weeks. She asked about help with transferring care to an adult neurology practice there. She has been otherwise healthy since she was last seen and has no other health concerns today other than previously mentioned.   Review of Systems: Please see the HPI for neurologic and other pertinent review of systems. Otherwise, all other systems were reviewed and were negative.    History reviewed. No pertinent past medical history. Hospitalizations: No., Head Injury: No., Nervous System Infections: No., Immunizations up to date: Yes.   Past Medical History Comments: Hospitalized at 21 years old due to seizure activity. Seizures were partial onset with secondary generalization. MRI of the brain was normal. EEG showed diffuse slowing as part of a post ictal state. She had onset of migraines at 84 or 21 years of age. Keppra has completely controlled her seizures, except for times when there have been issues with compliance.  She has taken Zomig with transient benefit. Propranolol was  started in 2008 as a preventative medication for migraine increased to 20 mg BId. She has tried Maxalt, Axert, and Relpax without benefit. Imitrex/sumatriptan has been more effective. Atenolol was started in August, 2009 and adjusted to 100 mg. Topiramate was started March, 2011. She reached 50 mg q HS and stopped it in January, 2012 due to cognitive blunting. Verapamil was started February, 2013.   Surgical History Past Surgical History:  Procedure Laterality Date  . IUD REMOVAL  01/19/2017   Mirena  . TYMPANOSTOMY TUBE PLACEMENT      Family History family history includes Asthma in her maternal grandmother; Breast cancer in her maternal aunt; Cancer in her brother and mother; Migraines in her brother, maternal uncle, and mother; Rectal cancer in her paternal grandfather; Stroke in her maternal grandfather; Thyroid disease in her sister. Family History is otherwise negative for migraines, seizures, cognitive impairment, blindness, deafness, birth defects, chromosomal disorder, autism.  Social History Social History   Socioeconomic History  . Marital status: Single    Spouse name: None  . Number of children: None  . Years of education: None  . Highest education level: None  Social Needs  . Financial resource strain: None  . Food insecurity - worry: None  . Food insecurity - inability: None  . Transportation needs - medical: None  . Transportation needs - non-medical: None  Occupational History  . None  Tobacco Use  . Smoking status: Never Smoker  . Smokeless tobacco: Never Used  Substance and Sexual Activity  . Alcohol use: No    Alcohol/week: 0.0 oz  . Drug use: No  . Sexual activity: Not Currently  Birth control/protection: None    Comment: Mirena inserted 03-20-13  Other Topics Concern  . 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  Oropharynx benign. Neck: Supple with no carotid bruits Cardiovascular: Regular rate and rhythm, no murmurs Respiratory: Breath sounds clear to auscultation Musculoskeletal: No obvious deformities or scoliosis Skin: No rashes or neurocutaneous lesions  Neurologic Exam Mental Status: Awake and fully alert.  Oriented to place and time.  Recent and remote memory intact.  Attention span, concentration, and fund of knowledge appropriate.  Mood and affect appropriate. Cranial Nerves: Fundoscopic exam reveals sharp disc margins.  Pupils equal, briskly reactive to light.  Extraocular movements full without nystagmus.  Visual fields full to confrontation.  Hearing intact and symmetric to finger rub.  Facial sensation intact.  Face tongue, palate move normally and symmetrically.  Neck flexion and extension normal. Motor: Normal bulk and tone. Normal strength in all tested extremity muscles. Sensory: Intact to touch and temperature in all extremities.  Coordination: Rapid alternating movements normal in all extremities.  Finger-to-nose and heel-to shin performed accurately bilaterally.  Romberg negative. Gait and Station: Arises from chair without difficulty.  Stance is normal. Gait demonstrates normal stride length and balance.   Able to heel, toe and tandem walk without difficulty. Reflexes: 1+ and symmetric. Toes downgoing.  Impression 1.  Generalized convulsive epilepsy, G40.309 2.  Partial epilepsy with impairment of consciousness, G40.209 3.  Migraine without aura, without status migrainosus, not intractable, G43.009   Recommendations for plan of care The patient's previous [facility] records were reviewed. Mary Jacobs has neither had nor required imaging or lab studies since  the last visit. She is a 21 year old woman with history of complex partial seizures with secondary generalization. She has remained seizure free on brand Keppra for years, and will continue this medication. She also has migraine without aura which has shown significant improvement on Verapamil. Mary Jacobs is moving to [place], [place] in 2 weeks. I talked with her about that and told her that I will refer her to an adult neurology practice in that city after she moves. I asked her to stay in touch by MyChart or phone in the interim.   The medication list was reviewed and reconciled.  No changes were made in the prescribed medications today.  A complete medication list was provided to the patient.  Allergies as of 08/03/2017   No Known Allergies     Medication List        Accurate as of 08/03/17 11:59 PM. Always use your most recent med list.          eletriptan 40 MG tablet Commonly known as:  RELPAX Take at onset of migraine with 400 mg of ibuprofen, may repeat in 2 hours if headache persists or recurs.   ibuprofen 800 MG tablet Commonly known as:  ADVIL,MOTRIN Take 1 tablet (800 mg total) by mouth every 8 (eight) hours as needed for pain.   KEPPRA 500 MG tablet Generic drug:  levETIRAcetam Take 1 tablet in the morning and 1 tablet at night   verapamil 40 MG  tablet Commonly known as:  CALAN Take 1 tablet in the morning and 1 tablet at nighttime       Dr. [name] was consulted regarding the patient.   Total time spent with the patient was 15 minutes, of which 50% or more was spent in counseling and coordination of care.   Rockwell Germany NP-C

## 2017-08-06 ENCOUNTER — Encounter (INDEPENDENT_AMBULATORY_CARE_PROVIDER_SITE_OTHER): Payer: Self-pay | Admitting: Family

## 2017-08-06 NOTE — Patient Instructions (Addendum)
Thank you for coming in today.   Instructions for you until your next appointment are as follows: 1. Continue your medications as you have been taking them.  2. When you have moved to [facility], please let me know your local address and phone number. I will refer you to an adult neurology practice in that area after you have moved.  3. Please sign up for MyChart if you have not done so  Good luck in your new adventure in [place]!

## 2017-11-12 ENCOUNTER — Encounter (INDEPENDENT_AMBULATORY_CARE_PROVIDER_SITE_OTHER): Payer: Self-pay | Admitting: Family

## 2017-11-12 DIAGNOSIS — G40309 Generalized idiopathic epilepsy and epileptic syndromes, not intractable, without status epilepticus: Secondary | ICD-10-CM

## 2017-11-12 DIAGNOSIS — G40209 Localization-related (focal) (partial) symptomatic epilepsy and epileptic syndromes with complex partial seizures, not intractable, without status epilepticus: Secondary | ICD-10-CM

## 2017-11-19 MED ORDER — KEPPRA 500 MG PO TABS: ORAL_TABLET | ORAL | 3 refills | Status: AC

## 2017-12-28 ENCOUNTER — Encounter (INDEPENDENT_AMBULATORY_CARE_PROVIDER_SITE_OTHER): Payer: PRIVATE HEALTH INSURANCE | Admitting: Clinical Neurophysiology

## 2018-01-05 ENCOUNTER — Ambulatory Visit (INDEPENDENT_AMBULATORY_CARE_PROVIDER_SITE_OTHER): Payer: PRIVATE HEALTH INSURANCE | Admitting: Clinical Neurophysiology

## 2018-01-05 VITALS — BP 115/61 | HR 52 | Wt 134.0 lb

## 2018-01-05 DIAGNOSIS — G43709 Chronic migraine without aura, not intractable, without status migrainosus: Secondary | ICD-10-CM

## 2018-01-05 DIAGNOSIS — G40309 Generalized idiopathic epilepsy and epileptic syndromes, not intractable, without status epilepticus: Secondary | ICD-10-CM | POA: Insufficient documentation

## 2018-01-05 NOTE — Patient Instructions (Signed)
Schedule for EEG and will call you if abnormal otherwise will discuss it next appointment.

## 2018-01-05 NOTE — Progress Notes (Addendum)
DATE: 01/05/2018     CHIEF CONCERN: epilepsy/migraine    HISTORY OF PRESENT ILLNESS:  22 yo RH F with hx of epilepsy and migraine presented to establish care. Recently moved from [place].     Was diagnosed with epilepsy at age of 13, Only had nocturnal seizures except the first one. Almost 5 total seizures so far. She used to take  daily but now on 500 bid which weaned off over the past 2 years. Seizure free since 2011, although she reports having one episode in April when she woke up on the floor and was confused went back to bed and slept. Used to have sleep walking. She has been very tired since she moved to [place] in Jan. Only been on keppra for seizures. No auras or warning. Tongue biting and loss of bladder with seizures in the past.   Hx of migraine now controlled on verapamil  bid and Relpax prn. Gets always left sided headaches since first grade, used to have nausea vomiting, + photo/phonophobia.   Always had short term memory problems.   Been on atenolol, topomax, TCAs in the past,   No hx of head trauma, meningitis, febrile seizures.    Per the note, hospitalized at age 24 due to seizure, seizures were partial onset with secondary generalization. MRI was normal and EEG showed diffuse slowing as part of post ictal state.   For migraines, has taken Zomig with transient benefit. Atenolol was started in August 2009 and adjusted to . Topiramate was started March 2011 and reached  qhs and was stopped Jan 2012 due to cognitive blunting. Verapamil was started Feb 2013     REVIEW OF SYMPTOMS:  Constitutional: Denies fever, unexplained weight gain or loss, + fatigue  HEENT: Denies visual changes, sinus congestion, dysphagia  Cardiovascular: Denies chest pain, palpitations, swollen legs  Respiratory: Denies cough, shortness of breath, wheezing, night sweats  Gastrointestinal: Denies appetite changes, nausea, vomiting, reflux  Genitourinary: Denies difficult urination, incontinence, kidney  stones  Musculoskeletal: Denies muscle cramps, muscle pain, joint swelling  Integumentary: Denies easy bruising, erythema, hives, light sensitivity  Psychiatric: Denies depression, suicidal/homicidal ideation, hallucinations  Endocrine: Denies hot/cold intolerance, palpitations, flushing, nail changes  Neurological: as above    All remaining systems have been reviewed and are negative.     PAST MEDICAL/SURGICAL HISTORY:   No past medical history on file.  No past surgical history on file.    Hx of epilepsy  Hx of migraine    SOCIAL HISTORY:  Social History     Socioeconomic History    Marital status: Not on file     Spouse name: Not on file    Number of children: Not on file    Years of education: Not on file    Highest education level: Not on file   Social Needs    Financial resource strain: Not on file    Food insecurity - worry: Not on file    Food insecurity - inability: Not on file    Transportation needs - medical: Not on file    Transportation needs - non-medical: Not on file   Occupational History    Not on file   Tobacco Use    Smoking status: Not on file   Substance and Sexual Activity    Alcohol use: Not on file    Drug use: Not on file    Sexual activity: Not on file   Other Topics Concern    Not on file  Social History Narrative    Not on file     Job [employer].  Denies drinking, smoking, drugs.  Lives alone recently moved from [place]    FAMILY HISTORY:  mother and brother and sister with migraines. No hx of seizures.    MEDICATIONS:  Outpatient Medications Prior to Visit   Medication Sig Dispense Refill    Eletriptan Hydrobromide 40 MG Oral Tab   5    KEPPRA 500 MG Oral Tab TK 1 IN MORNING AND 1 AT NIGHT  5    Verapamil HCl 40 MG Oral Tab TK 1 T PO  QAM AND 1 T AT NIGHTTIME  3     No facility-administered medications prior to visit.        ALLERGIES:  Review of patient's allergies indicates:  No Known Allergies    PHYSICAL EXAMINATION:  BP 115/61    Pulse 52    Wt 134 lb  (60.8 kg)     General appearance: Appears well. Well-developed, well nourished, in no apparent distress.    Mental status and language: Oriented to time, place and person, the patient exhibited normal attention and reasoning.  Intact recent and remote memory. There was no evidence of a thought disorder.  Affect and mood were unremarkable.  Speech was normal (naming and repeating) and prosody was intact. fund of information are normal and appropriate to the situation (awareness of current events, past history, vocabulary)    Cardiovascular: RRR    Neck: Supple, no stiffness    Cranial nerves: Visual fields are intact to confrontation. Pupils are equally round and reactive to light and near. No RAPD. Ocular motility is full without nystagmus, optic discs not visualized. Normal facial sensation. Facial expression is symmetric without facial weakness or hypomimia. Hearing acuity is intact to conversation. Palatal movements are symmetric and there is no palatal tremor. Sternocleidomastoid and shoulder shrug strength are intact. Tongue protrudes in the midline without atrophy, tremor or fasciculations.    Motor: Normal muscle bulk and tone in all 4 limbs \. Full strength in all 4 limbs. No pronator drift.     Reflexes: Brachioradialis, biceps, triceps, patellar and achillis reflexes normal and symmetric. No ankle clonus. No Babinski or Hoffman's.     Sensory: Normal light-touch, pinprick, vibration. There is no extinction to double simultaneous tactile stimulation.      Cerebellar and coordination: Rapid alternating movements intact bilaterally. Finger-nose-finger and heel-shin-knee testing intact.    Gait and station: No postural instability. Regular, tandem walking unremarkable. No festination    INFORMATION REVIEWED: Relevant history, Imaging, Labs, Medications    ASSESSMENT AND PLAN:  (G40.309) Nonintractable generalized idiopathic epilepsy without status epilepticus (HCC)  (primary encounter diagnosis)  Plan:  REFERRAL TO NEURODIAGNOSTIC TESTING      22 year old female with hx of epilepsy and migraine presented to establish care. No seizures since 2011 although reports having one episode in April when she woke up confused on the floor. Not sure if she had a dream and fell out of bed or had seizure. Her seizures were always nocturnal and well controlled on keppra which was weaned off over the past 2 years and currently on 500 bid. Migraines under control with verapamil 40 bid and Relpax prn. Discussed that given the recent possible seizure episode will obtain EEG and consider increasing the keppra although she is reluctant at this point.    Katina Degree, MD  [facility]  [phone]      I spent a total  time of 60 minutes face-to-face with the patient, of which more than 50% was spent counseling and coordinating care as outlined above in the assessment and plan.

## 2018-01-06 ENCOUNTER — Encounter (INDEPENDENT_AMBULATORY_CARE_PROVIDER_SITE_OTHER): Payer: Self-pay | Admitting: Family

## 2018-01-06 ENCOUNTER — Telehealth: Payer: Self-pay | Admitting: Family

## 2018-01-06 NOTE — Telephone Encounter (Signed)
I spoke with [facility] for about 16 minutes.  She had a confusional arousal sometime ago which scared her, but it was not clearly a seizure.  This happened out of sleep.  She did not bite her tongue, lose continence have muscle soreness.  She went to see a neurologist and did not have a good interaction.  The neurologist wanted to perform an EEG.  I told Mary Jacobs that I agreed with this, but that it might not be a definitive study that is he will not tell us if the nocturnal event truly was a seizure.  It could be completely normal with her having seizures or show evidence of seizure activity when she did not had a seizure.  She was also told by this doctor that she should be driving a car which does not sound right to me.  I told Mary Jacobs to go on Google and look up the role of seizures and rules for driver's license in the state of [place].  If this is true, it is quite surprising.  If it is not true, then the neurologist that she saw does not know something that she should know.  I told Mary Jacobs that as long as she had plenty of medication, that she did not need to rely on this physician nor did she need to go back and have an EEG at her practice.  I suggested that she might be able to go on social media or removal find health grades for other neurologist in her community [facility]).  She told me that she was more stressed and more sleep deprived than she had been.  We talked about that and I told her that she had to make changes or face the consequence of increased migraines and seizures.  She understands and has been making changes.  I told her that I would be happy to speak with her again if she needed help or advice but that she was going to find a neurologist who met her needs.

## 2018-01-06 NOTE — Telephone Encounter (Signed)
°  Who's calling (name and relationship to patient) : Theola Sequin (Self)  Best contact number: [phone] (H)  Provider they see: Otila Kluver   Reason for call: Returning Dr. Kelly Splinter phone call

## 2018-01-06 NOTE — Telephone Encounter (Signed)
I tried to call Mary Jacobs I left a message for her and told her to call back this afternoon should her to let me know when she could be reached if she cannot call.  I will send her a my chart message as well.

## 2018-01-06 NOTE — Telephone Encounter (Signed)
I do not know if this is a crossover call.  I left a message with the patient that she did not need to call back if that was the case.

## 2018-02-22 ENCOUNTER — Encounter (INDEPENDENT_AMBULATORY_CARE_PROVIDER_SITE_OTHER): Payer: Self-pay | Admitting: Family

## 2018-10-18 ENCOUNTER — Other Ambulatory Visit (INDEPENDENT_AMBULATORY_CARE_PROVIDER_SITE_OTHER): Payer: Self-pay | Admitting: Family

## 2018-10-18 DIAGNOSIS — G43009 Migraine without aura, not intractable, without status migrainosus: Secondary | ICD-10-CM

## 2018-11-07 ENCOUNTER — Other Ambulatory Visit (INDEPENDENT_AMBULATORY_CARE_PROVIDER_SITE_OTHER): Payer: Self-pay | Admitting: Family

## 2018-11-07 DIAGNOSIS — G43009 Migraine without aura, not intractable, without status migrainosus: Secondary | ICD-10-CM

## 2019-03-13 ENCOUNTER — Inpatient Hospital Stay: Payer: Self-pay

## 2019-05-08 ENCOUNTER — Encounter: Payer: Self-pay | Admitting: Gynecology

## 2019-06-18 DIAGNOSIS — R87810 Cervical high risk human papillomavirus (HPV) DNA test positive: Secondary | ICD-10-CM

## 2019-06-18 DIAGNOSIS — R87610 Atypical squamous cells of undetermined significance on cytologic smear of cervix (ASC-US): Secondary | ICD-10-CM

## 2019-06-18 HISTORY — DX: Atypical squamous cells of undetermined significance on cytologic smear of cervix (ASC-US): R87.610

## 2019-06-18 HISTORY — DX: Cervical high risk human papillomavirus (HPV) DNA test positive: R87.810

## 2019-06-28 ENCOUNTER — Other Ambulatory Visit: Payer: Self-pay

## 2019-06-28 DIAGNOSIS — Z20822 Contact with and (suspected) exposure to covid-19: Secondary | ICD-10-CM

## 2019-06-30 LAB — NOVEL CORONAVIRUS, NAA: SARS-CoV-2, NAA: NOT DETECTED

## 2019-07-03 ENCOUNTER — Other Ambulatory Visit: Payer: Self-pay

## 2019-07-04 ENCOUNTER — Encounter: Payer: Self-pay | Admitting: Gynecology

## 2019-07-04 ENCOUNTER — Ambulatory Visit (INDEPENDENT_AMBULATORY_CARE_PROVIDER_SITE_OTHER): Payer: 59 | Admitting: Gynecology

## 2019-07-04 VITALS — BP 118/72 | Ht 63.0 in | Wt 129.0 lb

## 2019-07-04 DIAGNOSIS — Z01419 Encounter for gynecological examination (general) (routine) without abnormal findings: Secondary | ICD-10-CM | POA: Diagnosis not present

## 2019-07-04 DIAGNOSIS — D241 Benign neoplasm of right breast: Secondary | ICD-10-CM

## 2019-07-04 DIAGNOSIS — Z13220 Encounter for screening for lipoid disorders: Secondary | ICD-10-CM

## 2019-07-04 LAB — COMPREHENSIVE METABOLIC PANEL
AG Ratio: 2 (calc) (ref 1.0–2.5)
ALT: 12 U/L (ref 6–29)
AST: 12 U/L (ref 10–30)
Albumin: 4.7 g/dL (ref 3.6–5.1)
Alkaline phosphatase (APISO): 44 U/L (ref 31–125)
BUN: 15 mg/dL (ref 7–25)
CO2: 26 mmol/L (ref 20–32)
Calcium: 9.8 mg/dL (ref 8.6–10.2)
Chloride: 103 mmol/L (ref 98–110)
Creat: 0.87 mg/dL (ref 0.50–1.10)
Globulin: 2.3 g/dL (calc) (ref 1.9–3.7)
Glucose, Bld: 79 mg/dL (ref 65–99)
Potassium: 4.1 mmol/L (ref 3.5–5.3)
Sodium: 138 mmol/L (ref 135–146)
Total Bilirubin: 0.8 mg/dL (ref 0.2–1.2)
Total Protein: 7 g/dL (ref 6.1–8.1)

## 2019-07-04 LAB — CBC WITH DIFFERENTIAL/PLATELET
Absolute Monocytes: 594 cells/uL (ref 200–950)
Basophils Absolute: 99 cells/uL (ref 0–200)
Basophils Relative: 1.5 %
Eosinophils Absolute: 231 cells/uL (ref 15–500)
Eosinophils Relative: 3.5 %
HCT: 44.9 % (ref 35.0–45.0)
Hemoglobin: 14.5 g/dL (ref 11.7–15.5)
Lymphs Abs: 1775 cells/uL (ref 850–3900)
MCH: 29.4 pg (ref 27.0–33.0)
MCHC: 32.3 g/dL (ref 32.0–36.0)
MCV: 91.1 fL (ref 80.0–100.0)
MPV: 10.9 fL (ref 7.5–12.5)
Monocytes Relative: 9 %
Neutro Abs: 3901 cells/uL (ref 1500–7800)
Neutrophils Relative %: 59.1 %
Platelets: 264 10*3/uL (ref 140–400)
RBC: 4.93 10*6/uL (ref 3.80–5.10)
RDW: 12.3 % (ref 11.0–15.0)
Total Lymphocyte: 26.9 %
WBC: 6.6 10*3/uL (ref 3.8–10.8)

## 2019-07-04 LAB — LIPID PANEL
Cholesterol: 168 mg/dL (ref <200)
HDL: 50 mg/dL (ref >=50)
LDL Cholesterol (Calc): 99 mg/dL (calc)
Non-HDL Cholesterol (Calc): 118 mg/dL (calc) (ref <130)
Total CHOL/HDL Ratio: 3.4 (calc) (ref <5.0)
Triglycerides: 91 mg/dL (ref <150)

## 2019-07-04 NOTE — Patient Instructions (Signed)
Follow-up in 1 year for annual exam, sooner if any issues. 

## 2019-07-04 NOTE — Progress Notes (Signed)
    Mary Jacobs [birth year] [id number]        23 y.o.  G0P0000 for annual gynecologic exam.  Had questions about mass in her right breast as discussed below.  Otherwise doing well.  Remains virginal  Past medical history,surgical history, problem list, medications, allergies, family history and social history were all reviewed and documented as reviewed in the EPIC chart.  ROS:  Performed with pertinent positives and negatives included in the history, assessment and plan.   Additional significant findings : None   Exam: Caryn Bee assistant Vitals:   07/04/19 0827  BP: 118/72  Weight: 129 lb (58.5 kg)  Height: 5\' 3"  (1.6 m)   Body mass index is 22.85 kg/m.  General appearance:  Normal affect, orientation and appearance. Skin: Grossly normal HEENT: Without gross lesions.  No cervical or supraclavicular adenopathy. Thyroid normal.  Lungs:  Clear without wheezing, rales or rhonchi Cardiac: RR, without RMG Abdominal:  Soft, nontender, without masses, guarding, rebound, organomegaly or hernia Breasts:  Examined lying and sitting.  Left without masses, retractions, discharge or axillary adenopathy.  Right with pea size nodule 5 o'clock position 2 fingerbreadths off the areola.  Mobile with no overlying skin changes Pelvic:  Ext, BUS, Vagina: Normal  Cervix: Normal  Uterus: Anteverted, normal size, shape and contour, midline and mobile nontender   Adnexa: Without masses or tenderness    Anus and perineum: Normal    Assessment/Plan:  23 y.o. G0P0000 female for annual gynecologic exam.  With regular menses, abstinent contraception  1. Right breast mass.  Present over the past year or so.  Was evaluated in [place] to include ultrasound and biopsy which showed fibroadenoma.  Patient wanted my opinion as far as management whether to have it excised or not.  Given the stability and easy to palpate my recommendation would be to follow expectantly for now.  Continue with self breast  exams and report any changes.  Patient is comfortable with this approach. 2. Pap smear 2017.  Pap smear done today.  No history of abnormal Pap smears. 3. Gardasil series reportedly received. 4. STD screening not indicated. 5. Health maintenance.  Discussed baseline labs.  Patient wants to go ahead and have drawn.  CBC, CMP and lipid profile ordered.  Follow-up 1 year, sooner as needed.   Anastasio Auerbach MD, 8:59 AM 07/04/2019

## 2019-07-04 NOTE — Addendum Note (Signed)
Addended by: Nelva Nay on: 07/04/2019 09:53 AM   Modules accepted: Orders

## 2019-07-07 LAB — PAP IG W/ RFLX HPV ASCU

## 2019-07-07 LAB — HUMAN PAPILLOMAVIRUS, HIGH RISK: HPV DNA High Risk: DETECTED — AB

## 2019-07-10 ENCOUNTER — Encounter: Payer: Self-pay | Admitting: Gynecology

## 2019-07-10 ENCOUNTER — Other Ambulatory Visit: Payer: Self-pay

## 2019-07-10 MED ORDER — FLUCONAZOLE 150 MG PO TABS: 150.0000 mg | ORAL_TABLET | Freq: Once | ORAL | 0 refills | Status: AC

## 2019-07-17 ENCOUNTER — Other Ambulatory Visit: Payer: Self-pay

## 2019-07-18 ENCOUNTER — Encounter: Payer: Self-pay | Admitting: Gynecology

## 2019-07-18 ENCOUNTER — Ambulatory Visit: Payer: 59 | Admitting: Gynecology

## 2019-07-18 VITALS — BP 116/72

## 2019-07-18 DIAGNOSIS — N72 Inflammatory disease of cervix uteri: Secondary | ICD-10-CM | POA: Diagnosis not present

## 2019-07-18 DIAGNOSIS — R87610 Atypical squamous cells of undetermined significance on cytologic smear of cervix (ASC-US): Secondary | ICD-10-CM

## 2019-07-18 DIAGNOSIS — R87810 Cervical high risk human papillomavirus (HPV) DNA test positive: Secondary | ICD-10-CM

## 2019-07-18 NOTE — Addendum Note (Signed)
Addended by: Anastasio Auerbach on: 07/18/2019 10:15 AM   Modules accepted: Orders

## 2019-07-18 NOTE — Patient Instructions (Addendum)
Office will call with biopsy results 

## 2019-07-18 NOTE — Progress Notes (Signed)
    MERIDYTH SCHADE 1996/06/17 [id number]        23 y.o.  G0P0000 presents for colposcopy.  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  No history of abnormal Pap smears previously  Past medical history,surgical history, problem list, medications, allergies, family history and social history were all reviewed and documented in the EPIC chart.  Directed ROS with pertinent positives and negatives documented in the history of present illness/assessment and plan.  Exam: Caryn Bee assistant Vitals:   07/18/19 0902  BP: 116/72   General appearance:  Normal Abdomen soft nontender without mass guarding rebound Pelvic external BUS vagina normal.  Cervix normal.  Uterus normal size midline mobile nontender.  Adnexa without masses or tenderness.  Colposcopy performed after acetic acid cleanse was adequate noting transformation zone visualized 360 degrees.  Area of acetowhite change 3 through 5:00 transformation zone.  Representative biopsy taken.  ECC performed.  Patient tolerated well.  Physical Exam  Genitourinary:        Assessment/Plan:  23 y.o. 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  The patient I discussed dysplasia, high-grade/low-grade, progression/regression and the HPV association.  Colposcopy shows area of acetowhite change 3-5 o'clock transformation zone.  Representative biopsy taken.  ECC performed.  Patient will follow-up for results.  If normal/low-grade then plan expectant management with follow-up Pap smear/HPV in 1 year.  If otherwise then will triage based upon results.  Anastasio Auerbach MD, 9:35 AM 07/18/2019

## 2019-07-20 ENCOUNTER — Encounter: Payer: Self-pay | Admitting: Gynecology

## 2019-07-20 LAB — PATHOLOGY REPORT

## 2019-07-20 LAB — TISSUE PATH REPORT

## 2019-08-07 ENCOUNTER — Ambulatory Visit: Payer: 59 | Attending: Internal Medicine

## 2019-08-07 DIAGNOSIS — Z20822 Contact with and (suspected) exposure to covid-19: Secondary | ICD-10-CM

## 2019-08-09 LAB — NOVEL CORONAVIRUS, NAA: SARS-CoV-2, NAA: NOT DETECTED

## 2020-04-04 ENCOUNTER — Encounter: Payer: Self-pay | Admitting: Obstetrics and Gynecology

## 2020-04-04 ENCOUNTER — Ambulatory Visit (INDEPENDENT_AMBULATORY_CARE_PROVIDER_SITE_OTHER): Payer: Self-pay | Admitting: Obstetrics and Gynecology

## 2020-04-04 ENCOUNTER — Other Ambulatory Visit: Payer: Self-pay

## 2020-04-04 VITALS — BP 116/74

## 2020-04-04 DIAGNOSIS — Z3009 Encounter for other general counseling and advice on contraception: Secondary | ICD-10-CM

## 2020-04-04 NOTE — Progress Notes (Signed)
GGA Note  Patient presents for IUD consultation and she desires to have an IUD placed.  She previously had a 5-year hormonal IUD and had a good experience with that and would like to have another one placed.  She has no questions about the IUD.  She presents today per the insurance/office policy for consultation regarding the IUD prior to having it placed on a separate day.  She lives in [place] state and will be leaving soon and not returning until much later in the year so we would like to try to get this expedited for her and we will have staff work on getting the prior authorization going and try to get her back in soon as possible before she leaves town so she can get the IUD placed.  No charge for today's encounter.   Joseph Pierini MD

## 2020-04-05 ENCOUNTER — Encounter: Payer: Self-pay | Admitting: Obstetrics and Gynecology

## 2020-04-05 ENCOUNTER — Ambulatory Visit (INDEPENDENT_AMBULATORY_CARE_PROVIDER_SITE_OTHER): Payer: 59 | Admitting: Obstetrics and Gynecology

## 2020-04-05 VITALS — BP 116/72

## 2020-04-05 DIAGNOSIS — Z30430 Encounter for insertion of intrauterine contraceptive device: Secondary | ICD-10-CM | POA: Diagnosis not present

## 2020-04-05 DIAGNOSIS — Z3009 Encounter for other general counseling and advice on contraception: Secondary | ICD-10-CM

## 2020-04-05 NOTE — Progress Notes (Signed)
   Mary Jacobs  1996/05/18 [id number]  HPI The patient is a 24 y.o. G0P0000 who presents today for Mirena IUD insertion.  She has previously had an IUD in the past.  I counseled her on the Mirena IUD including in the initial period of time after insertion to expect cramping and/or abnormal bleeding, especially in the first 3-6 months use.  Approved for 5 years of contraception.  After this time period the period can get very light or some women become amenorrheic.  Potential hormonal side effects discussed.  Insertion risks include infection, uterine perforation and device migration into the abdomen which would require surgical removal, or the device can become dislodged causing pain or fall out of which would preclude effective contraception.  If sexually active by recommend using backup contraception (condoms) for the first week after insertion.   Monthly self string checks (or more frequent) are recommended as IUD expulsion does occur.  She wanted to proceed with the Mirena IUD insertion and provided written consent.  Not currently on her period, expecting it next week.  She has not been sexually active anytime in the last month and declines offer to check a pregnancy test prior to IUD insertion today.   Physical Exam  BP 116/72   LMP 03/13/2020   General: Pleasant female, no acute distress, alert and oriented PELVIC EXAM: VULVA: normal appearing vulva with no masses, tenderness or lesions, VAGINA: normal appearing vagina with normal color and discharge, no lesions, CERVIX: normal appearing cervix without discharge or lesions, UTERUS: uterus is normal size, shape, consistency and nontender, ADNEXA: normal adnexa in size, nontender and no masses  Procedure note Mirena IUD insertion The cervix was cleansed with a Betadine swab.  The cervix was gently dilated with the cervical os finder.  Endometrial pipelle was used to obtain a sound length of 7 cm.  Maintaining sterility, the flange on the  IUD insertion tube was set to the appropriate length and the insertion tube was inserted into the cervix and endometrial cavity without difficulty until gentle fundal resistance was met.  The arms of the IUD were deployed and the Mirena was inserted without difficulty. The strings were trimmed to a length of 3 cm.  The patient tolerated the procedure well without any complication.  Caryn Bee present for exam and procedure  Joseph Pierini MD 04/05/20

## 2020-04-09 ENCOUNTER — Encounter: Payer: Self-pay | Admitting: Gynecology

## 2021-01-31 ENCOUNTER — Encounter (HOSPITAL_BASED_OUTPATIENT_CLINIC_OR_DEPARTMENT_OTHER): Payer: Self-pay | Admitting: Physical Medicine & Rehabilitation

## 2021-01-31 ENCOUNTER — Ambulatory Visit
Payer: No Typology Code available for payment source | Attending: Physical Medicine & Rehabilitation | Admitting: Physical Medicine & Rehabilitation

## 2021-01-31 DIAGNOSIS — S63408A Traumatic rupture of unspecified ligament of other finger at metacarpophalangeal and interphalangeal joint, initial encounter: Secondary | ICD-10-CM

## 2021-01-31 NOTE — Progress Notes (Signed)
[facility] at [facility]  [address] [place], [place] [address]   TEL: [phone], option #2  FAX: [phone]   [email or web address]       SPORTS MEDICINE CLINIC NOTE    01/31/2021    PRIMARY CARE PROVIDER:   No primary provider on file.    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  She is a Catering manager but also a climber and this past Saturday while climbing heard a pop and injured her finger.  She denies having had any swelling in the past few days.  Denied a fall or direct blunt force trauma to the finger.  She has been followed by Grace Ramirez, physical therapist at [facility] who referred her to [place] for a diagnostic ultrasound of suspected pulley injury.  Of note the patient has been through a similar injury on her opposite right ring finger about 7 months ago that took about 2 months to improve.  With this opposite finger she had noted significant swelling.     Initially did not seek treatment for that oppsite side but underwent physical therapy eventually as well as splinting.     PAST MEDICAL HISTORY:  None    ALLERGIES:  Review of patient's allergies indicates:  No Known Allergies    MEDICATIONS:  Current Outpatient Medications   Medication Sig Dispense Refill   . Eletriptan Hydrobromide 40 MG Oral Tab   5   . KEPPRA 500 MG Oral Tab TK 1 IN MORNING AND 1 AT NIGHT  5   . Verapamil HCl 40 MG Oral Tab TK 1 T PO  QAM AND 1 T AT NIGHTTIME  3     No current facility-administered medications for this visit.       FAMILY HISTORY:  Denies any significant family history    SH:  Social History     Tobacco Use   . Smoking status: Never Smoker   . Smokeless tobacco: Never Used    Substance Use Topics   . Alcohol use: Yes     Comment: occ         I personally reviewed and confirmed the ROS, past medical history, past surgical history, family history and social history in the record with the patient today.      REVIEW OF SYSTEMS:  A complete review of systems is negative except for what is documented in the HPI above and what is noted here: None      PHYSICAL EXAMINATION:    Exercise Vitals:      General: healthy, alert  Psychologic: normal mood   HEENT: Normocephalic  Pulmonary: Non-labored respirations  Cardiovascular: No clubbing, cyanosis, edema  Skin: No lesions in areas examined  MSK: Normal sensation throughout the left hand.  There is no visible swelling or erythema in any finger.  She has good strength with resisted DIP flexion but pain with PIP flexion left ring finger.  There is good strength with resisted extension of the DIP and PIP joint.  Tender to palpation over the left ring finger volar proximal phalanx.  No  tenderness over the MCP joint, PIP joint nor DIP joint    Imaging/Diagnostic studies: Reviewed with the patient in clinic  Diagnostic ultrasound left ring finger, tendon/pulley evaluation 01-31-21  -Comparison was made to both her asymptomatic left middle finger as well as her opposite now "healed" right ring finger.  -Flexor digitorum and flexor superficialis tendons appear intact.  -There is a fluid gap between the flexor tendons and the proximal phalanx.  -In short axis there is ill-defined partial tear in both the volar and collateral aspects of the A2 pulley  -Mild bowstringing of the flexor tendons over the proximal phalanx and active flexion.  (Video taken.  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  We spent some time reviewing the anatomy of the fingers, flexor tendons, pulleys, mechanism of injury most commonly seen in climbers.  Performed a diagnostic ultrasound as above and reviewed with the patient today.  It appears that there is some fluid between the flexor tendons and proximal phalanx and some very mild bowstringing which be consistent with a partial A2 pulley tear.    This is typically treated nonoperatively with physical therapy, splinting and does not require surgery.    Discussed the timeframe for return to sport with this injury can be 6 to 8 weeks but has been my experience that it may take longer and up to 3 months.  She seemed to be in agreement with this as she has undergone a similar injury on her opposite right hand.  She notes that her main passion/sport is rowing and has a big event in August so likely will be taking off a whole year from climbing anyways.  She will continue to work with [employer] [facility] and splinting.    Follow-up with me as needed.      Park Liter, MD, RMSK, CAQ Sports Medicine, [facility]  Clinical Associate Professor  Associate Program Director, Sports Medicine Fellowship  Attending Physician, Department of Rehabilitation Medicine  [facility]  Division of  Sports, Spine and  Orthopedic Health  [facility]

## 2021-03-06 ENCOUNTER — Telehealth (HOSPITAL_BASED_OUTPATIENT_CLINIC_OR_DEPARTMENT_OTHER): Payer: Self-pay | Admitting: Physical Medicine & Rehabilitation

## 2021-03-06 NOTE — Telephone Encounter (Signed)
RETURN CALL: Voicemail - Detailed Message      SUBJECT:  General Message     MESSAGE: Patient is requesting the ultrasound report and the chart notes/summary of her visit on 6/17. She would like this information to be sent to her mychart account.    Patient also states that provider noticed on her old injury, that she has fluid under the tendon and patient wants to know if it's a bad idea to splint it to get that fluid out under the tendon?

## 2021-03-06 NOTE — Telephone Encounter (Signed)
Name: Grace Ramirez  Date of last visit: 01/31/2021 with Gustavus Bryant  Diagnosis: Rupture of flexor sheath pulley of finger  Future appt: NA    Spoke with pt.  Let pt know that there are no formal reports from ultrasound save what the provider comments on in his note.  Also let pt know that her MyChart with [facility] is not active.  Sent her a new activation code.  Pt would like the note from her visit with [facility].  Let her know I could sent that through MyChart once her account is active.      Pt is also asking about the old injury on her R hand.  Since she saw that there was fluid under the tendon on that injury, pt wants to know if she needs splinting on that one.  Presently this is not bothering her but she is concerned that it could become an issue if there is fluid there.  Will route to provider for consideration.    Trey Paula, BSN, RN  [facility] at [facility]

## 2021-03-24 NOTE — Telephone Encounter (Signed)
RETURN CALL: Voicemail - General Message      SUBJECT:  General Message     MESSAGE:     Patient is requesting email of her visit summary on 01/31/21 with Dr. Gustavus Bryant. Patient advised not able to reset password right now through [facility] and is requesting the visit summary.      Email Address: [email or web address]    Thank you

## 2021-03-24 NOTE — Telephone Encounter (Signed)
Printed and scanned AVS to email provided.  Called pt to confirm receipt but they did not answer.  LVM and awaiting call back.

## 2021-07-03 ENCOUNTER — Encounter (HOSPITAL_BASED_OUTPATIENT_CLINIC_OR_DEPARTMENT_OTHER): Payer: Self-pay

## 2023-04-12 NOTE — Progress Notes (Signed)
Interim History:    Grace Ramirez returns to clinic for follow-up for migraine headaches and nocturnal seizures.  Seizure-free.  Her headaches have responded very well to Ajovy, she was previously having weekly headaches but since starting Ajovy her headaches are now at most 2/month.  She is very pleased with her response no side effects.  She is hopeful to come off of her antiepileptic towards the end of the year.    Problem List, Medications, and other relevant history reviewed.  Chart updated as appropriate.    ROS: Negative except as outlined above.    Objective:  BP 116/69 (Arm: LEFT, Position: SITTING)   Pulse (!) 52   Wt 130 lb (59 kg)   BMI 23.03 kg/m   A detailed neurological exam is normal, without new localizing findings compared to the most recent examination documented in the medical record.    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  We discussed the pros and cons of tapering off of her current medications.  She wishes to become pregnant in the next several years so this would be a good reason to taper off of seizure meds.  However minimizing any risk of breakthrough seizures which could impact her driving is also a consideration to take into account.  She plan on stopping this towards the end of the year she will send me a message and we will advise her on the taper.  If she decides to taper off Keppra I advised her to contact me, would like for her to cut back to half a dose for about 1 to 2 weeks then stop.  We will repeat an EEG 1 to 2 weeks after she stops Keppra to make sure she does not have any interictal activity.  She will also not drive during her taper.     2: Episodic migraine headaches: Previously has not responded to propranolol, verapamil, topiramate.  She is currently doing very well with Ajovy prevention with a greater than 50% improvement in headache frequency and intensity.  She will continue this for now.      Plan:  As above  Follow-up annually if doing well       See order summary for all orders and prescriptions written during this visit.      Braden Iverson Alamin, MD    Voice recognition technology may have been employed in the creation of this record.  Every attempt was made to ensure its accuracy but transcription errors may still be present.    Total time spent on this calendar day (face to face and non-face to face) patient care, including but not limited to counseling, visit and exam, chart review, independent interpretation and ordering of tests/medication refills, and medical documentation in the electronic medical record was 20 minutes.

## 2023-05-24 ENCOUNTER — Emergency Department
Admission: EM | Admit: 2023-05-24 | Discharge: 2023-05-24 | Disposition: A | Discharge status: Home / Self Care | Payer: No Typology Code available for payment source | Attending: Emergency Medicine | Admitting: Emergency Medicine

## 2023-05-24 ENCOUNTER — Other Ambulatory Visit: Payer: Self-pay

## 2023-05-24 DIAGNOSIS — G40802 Other epilepsy, not intractable, without status epilepticus: Secondary | ICD-10-CM | POA: Insufficient documentation

## 2023-05-24 DIAGNOSIS — R112 Nausea with vomiting, unspecified: Secondary | ICD-10-CM | POA: Insufficient documentation

## 2023-05-24 DIAGNOSIS — R197 Diarrhea, unspecified: Secondary | ICD-10-CM | POA: Insufficient documentation

## 2023-05-24 DIAGNOSIS — Z79899 Other long term (current) drug therapy: Secondary | ICD-10-CM | POA: Insufficient documentation

## 2023-05-24 LAB — CBC, DIFF
% Basophils: 1 %
% Eosinophils: 2 %
% Immature Granulocytes: 1 %
% Lymphocytes: 12 %
% Monocytes: 13 %
% Neutrophils: 71 %
% Nucleated RBC: 0 %
Absolute Eosinophil Count: 0.15 10*3/uL (ref 0.00–0.50)
Absolute Lymphocyte Count: 0.76 10*3/uL — ABNORMAL LOW (ref 1.00–4.80)
Basophils: 0.03 10*3/uL (ref 0.00–0.20)
Hematocrit: 44 % (ref 36.0–45.0)
Hemoglobin: 14.4 g/dL (ref 11.5–15.5)
Immature Granulocytes: 0.03 10*3/uL (ref 0.00–0.05)
MCH: 29.6 pg (ref 27.3–33.6)
MCHC: 32.8 g/dL (ref 32.2–36.5)
MCV: 90 fL (ref 81–98)
Monocytes: 0.84 10*3/uL — ABNORMAL HIGH (ref 0.00–0.80)
Neutrophils: 4.55 10*3/uL (ref 1.80–7.00)
Nucleated RBC: 0 10*3/uL
Platelet Count: 246 10*3/uL (ref 150–400)
RBC: 4.86 10*6/uL (ref 3.80–5.00)
RDW-CV: 13.2 % (ref 11.0–14.5)
WBC: 6.36 10*3/uL (ref 4.3–10.0)

## 2023-05-24 LAB — URINALYSIS WITH REFLEX CULTURE
Bilirubin (Qual), URN: NEGATIVE
Epith Cells_Renal/Trans,URN: NEGATIVE /HPF
Glucose Qual, URN: NEGATIVE
Ketones, URN: NEGATIVE
Leukocyte Esterase, URN: POSITIVE — AB
Nitrite, URN: NEGATIVE
Specific Gravity, URN: 1.03 g/mL — ABNORMAL HIGH (ref 1.006–1.027)
pH, URN: 6 (ref 5.0–8.0)

## 2023-05-24 LAB — COMPREHENSIVE METABOLIC PANEL
ALT (GPT): 21 U/L (ref 7–33)
AST (GOT): 20 U/L (ref 9–38)
Albumin: 4.6 g/dL (ref 3.5–5.2)
Alkaline Phosphatase (Total): 38 U/L (ref 25–100)
Anion Gap: 8 (ref 4–12)
Bilirubin (Total): 0.8 mg/dL (ref 0.2–1.3)
Calcium: 8.8 mg/dL — ABNORMAL LOW (ref 8.9–10.2)
Carbon Dioxide, Total: 25 meq/L (ref 22–32)
Chloride: 103 meq/L (ref 98–108)
Creatinine: 0.74 mg/dL (ref 0.38–1.02)
Glucose: 98 mg/dL (ref 62–125)
Potassium: 3.7 meq/L (ref 3.6–5.2)
Protein (Total): 7.2 g/dL (ref 6.0–8.2)
Sodium: 136 meq/L (ref 135–145)
Urea Nitrogen: 17 mg/dL (ref 8–21)
eGFR by CKD-EPI 2021: >60 mL/min/{1.73_m2} (ref >59)

## 2023-05-24 LAB — PREGNANCY (HCG), SERUM, QUANT: Pregnancy (HCG), SRM: <1 m[IU]/mL (ref <6)

## 2023-05-24 LAB — RAPID FLU A & B,  RSV, SARS-COV-2 PCR
COVID-19 Coronavirus Qual PCR Result: NOT DETECTED
Rapid Influenza A PCR Result: NOT DETECTED
Rapid Influenza B PCR Result: NOT DETECTED
Rapid RSV PCR Result: NOT DETECTED

## 2023-05-24 LAB — MAGNESIUM: Magnesium: 2.1 mg/dL (ref 1.8–2.4)

## 2023-05-24 LAB — ENTERIC PATHOGENS PANEL

## 2023-05-24 LAB — LIPASE: Lipase: 23 U/L (ref <70)

## 2023-05-24 LAB — REFLEX CULTURE FOR UA

## 2023-05-24 MED ORDER — ONDANSETRON HCL 4 MG/2ML IJ SOLN
8.0000 mg | Freq: Once | INTRAMUSCULAR | Status: AC
Start: 2023-05-24 — End: 2023-05-24
  Administered 2023-05-24: 8 mg via INTRAVENOUS
  Filled 2023-05-24: qty 4

## 2023-05-24 MED ORDER — ONDANSETRON 4 MG OR TBDP
8.0000 mg | ORAL_TABLET | Freq: Two times a day (BID) | ORAL | 0 refills | Status: AC | PRN
Start: 2023-05-24 — End: ?

## 2023-05-24 MED ORDER — LACTATED RINGERS BOLUS
1000.0000 mL | Freq: Once | INTRAVENOUS | Status: AC
Start: 2023-05-24 — End: 2023-05-24
  Administered 2023-05-24: 1000 mL via INTRAVENOUS
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
## 2023-05-24 NOTE — Discharge Instructions (Addendum)
You were seen in the emergency department for nausea/vomiting with watery diarrhea. This is likely caused by a viral infection called viral gastroenteritis, likely from norovirus.  You can take ondansetron 8 mg every 12 hours as needed for nausea and vomiting.  Follow-up your enteric stool panel on MyChart.    There is no specific treatment for this viral infection, and the body will fight off the infection on its own over time. The most important thing is to provide good supportive care. This includes treating nausea with antiemetic medications such as zofran, treating fever and pain with tylenol and ibuprofen, and encouraging as much fluid intake as possible. Electrolyte rich fluids are best to replace the salt losses with vomiting and diarrhea (pedialyte, gatorade, broth). Fevers (temperature >100.4) are OK and not dangerous. They are, however, uncomfortable and should therefore be treated with tylenol and/or ibuprofen.     Please follow-up with your PCP in the next 1 to 2 weeks.  Return to the emergency department if any new concerning signs or symptoms, inability to eat or drink, weakness, dehydration, severe pain.

## 2023-05-24 NOTE — Progress Notes (Signed)
Emergency Department Culture Follow-Up    Grace Ramirez is a 27 year old female with past medical history as listed below who presented to the ED on 10/07 for diarrhea/vomiting. See ED provider note for further details.  Patient was diagnosed with nausea, vomiting and diarrhea with suspected norovirus and no antibiotic prescription was given.    No past medical history on file.     On 05/24/23,  stool culture  results as follows:    All Microbiology Results This Encounter       Procedure Component Value Units Date/Time    Enteric Pathogens Panel [[id number]]  (Abnormal) Collected: 05/24/23 0721    Lab Status: Final result Specimen: Stool Updated: 05/24/23 1333     Special Requests No special requests     Enteric Pathogen PCR --     NOROVIRUS  PCR test positive (Methodology: Multiplex PCR)  - For inpatients, isolate using contact enteric precautions per institutional policy.  Contact Infection Control if you have any questions.       Enteric Pathogen PCR If clinical presentation is inconsistent with Norovirus infection, please submit an add-on request for confirmatory testing within 3 days.     Enteric Pathogen PCR Comment: See link for detailed information about pathogens detected by this assay: [email or web address]     Culture Reflexive bacterial culture not indicated.            Spoke to patient. She stated that she has not has any episodes of diarrhea since discharge today but is nauseous (has Zofran prescription). Patient was counseled on adequate hydration and hand hygiene while symptoms are present and advised to return to ED if symptoms worsen.      Thank you,  Milon Dikes, PharmD  PGY1 Pharmacy Resident
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
## 2023-05-25 LAB — URINE C/S: Culture: >100000 — AB

## 2023-08-27 ENCOUNTER — Other Ambulatory Visit: Payer: Self-pay

## 2023-08-27 ENCOUNTER — Ambulatory Visit: Payer: No Typology Code available for payment source

## 2023-09-01 ENCOUNTER — Encounter (HOSPITAL_BASED_OUTPATIENT_CLINIC_OR_DEPARTMENT_OTHER): Payer: Self-pay

## 2023-09-03 ENCOUNTER — Other Ambulatory Visit (HOSPITAL_COMMUNITY): Payer: Self-pay

## 2023-09-03 ENCOUNTER — Other Ambulatory Visit (HOSPITAL_BASED_OUTPATIENT_CLINIC_OR_DEPARTMENT_OTHER): Payer: Self-pay

## 2023-09-03 ENCOUNTER — Ambulatory Visit: Payer: No Typology Code available for payment source

## 2023-09-03 DIAGNOSIS — Z7689 Persons encountering health services in other specified circumstances: Secondary | ICD-10-CM

## 2023-09-10 ENCOUNTER — Ambulatory Visit
Payer: No Typology Code available for payment source | Attending: Physical Medicine & Rehabilitation | Admitting: Physical Medicine & Rehabilitation

## 2023-09-10 ENCOUNTER — Encounter (HOSPITAL_BASED_OUTPATIENT_CLINIC_OR_DEPARTMENT_OTHER): Payer: Self-pay | Admitting: Physical Medicine & Rehabilitation

## 2023-09-10 VITALS — BP 117/80 | HR 73 | Temp 97.0°F

## 2023-09-10 DIAGNOSIS — S83411A Sprain of medial collateral ligament of right knee, initial encounter: Secondary | ICD-10-CM | POA: Insufficient documentation

## 2023-09-10 NOTE — Progress Notes (Signed)
[facility]  [address] [place], [place] [address]   TEL: [phone], option #2  FAX: [phone]   [email or web address]       SPORTS MEDICINE CLINIC NOTE    09/10/2023    PRIMARY CARE PROVIDER:   Philis Nettle, ARNP  [facility] [address]  [place],  [place] [address]    CONSULTING PROVIDER:   Self Referred            PATIENT: Grace Ramirez    [id number]    REASON FOR CONSULTATION/CC: Acute right knee injury    History of Present Illness:      Grace Ramirez is a very pleasant 28 year old presenting for evaluation of acute right knee injury.  I last saw her in clinic 01-31-21 for an acute left ring finger injury with climbing.  Suspected an A2 pulley injury. Doing well.     About 2 weeks ago skiing and body twisted. Felt knee was going to "pop out."  Did not necessarily hear a pop  For first week was painful to walk.   Iced, rested  Most pain in the anterior medial knee .  Now pain with kneeling, sitting crossed leg.   Overall better   Sister in PT school and has given her MCL exercises.   Seen at the polyclinic where she had x-rays 1-10 -25      PAST MEDICAL HISTORY:  Past Medical History:   Diagnosis Date    Tendon disorder        ALLERGIES:  Review of patient's allergies indicates:  No Known Allergies    MEDICATIONS:  Current Outpatient Medications   Medication Sig Dispense Refill    Eletriptan Hydrobromide 40 MG Oral Tab   5    KEPPRA 500 MG Oral Tab TK 1 IN MORNING AND 1 AT NIGHT  5    ondansetron 4 MG disintegrating tablet Dissolve 2 tablets (8 mg) on top of tongue and swallow every 12 hours as needed for nausea/vomiting. 20 tablet 0    Verapamil HCl 40 MG Oral Tab TK 1 T PO  QAM AND 1 T AT NIGHTTIME  3     No current facility-administered medications for this visit.         PHYSICAL EXAMINATION:    General: healthy, alert  Neuro: Strength is 5/5 lower extremities  MSK: No swelling or effusion in either  knee.  Very minimal tenderness palpation right medial joint line.  No tenderness along the MCL.  No pain or laxity with valgus or varus stress testing.  No laxity with Lachman's.  Able to perform a single-leg and double leg squat without significant pain.  No significant tenderness over the medial or lateral patellar facet.  Negative McMurray's    Imaging/Diagnostic studies: Reviewed with the patient in clinic  X-rays right knee 1 - 10 - 25  -No acute bony abnormality.  Well-maintained joint spaces      Ultrasound right medial knee 09-10-23  -Normal-appearing MCL without any fiber disruption.  Compared to the asymptomatic left side which appeared very similar.  -No obvious peripheral meniscal tear nor parameniscal cyst      Impression:  Grace Ramirez is a very pleasant 28 year old woman with acute right anterior medial knee pain after twisting skiing injury 2 weeks ago, at this time most consistent with likely mild MCL sprain versus sprain of the joint    Discussion/Plan:  I had a thorough discussion with Grace Ramirez regarding the above impression and forgoing management plan.  Her biggest concern is that she has a right MCL sprain.  Based on my physical examination today it would seem possible that there is some involvement MCL although her exam is very reassuring.  I have a low suspicion for an ACL tear or major meniscal pathology.  We spent entirely doing the above x-rays which were also reassuring.  She asked about an ultrasound examination for the [facility] which we performed today and I reviewed the images with her.  This shows an intact appearing MCL without any fiber disruption.  This is compared to the asymptomatic left side as well.  In the visualized aspect of the peripheral right meniscus I do not see any evidence of a parameniscal cyst or tear.    Based on the reassuring examination above imaging, recommended that she continue with her home exercises.  I provided her with some additional exercises  today to complement the exercises she had been given by her sister who is a physical therapy student in [place] [employer].  No formal physical therapy written today.      I will plan on seeing her back as needed.        Park Liter, MD, RMSK, CAQ Sports Medicine, [facility]  Clinical Associate Professor  Associate Program Director, Sports Medicine Fellowship  Attending Physician, Department of Rehabilitation Medicine  [facility] [place]  Division of  Sports, Spine and  Orthopedic Health  [facility]      FOR PATIENTS READING THEIR MEDICAL RECORD NUMBER The primary purpose of this note is documentation of information for your care team that facilitates a comprehensive understanding of your health. It may include non-standard sentence structure, medical abbreviations, and inherently personal aspects of your social history and lifestyle. Please be advised that this information is confidential, protected, and used only to better serve your needs as our patient.

## 7744-04-17 DEATH — deceased
